# Patient Record
Sex: Female | Born: 1970 | Race: White | Hispanic: No | Marital: Married | Smoking: Never smoker
Health system: Southern US, Community
[De-identification: ages and names within clinical notes are randomized; demographics above are authoritative.]

## PROBLEM LIST (undated history)

## (undated) DIAGNOSIS — R112 Nausea with vomiting, unspecified: Secondary | ICD-10-CM

## (undated) DIAGNOSIS — N63 Unspecified lump in unspecified breast: Secondary | ICD-10-CM

## (undated) DIAGNOSIS — M722 Plantar fascial fibromatosis: Secondary | ICD-10-CM

## (undated) DIAGNOSIS — Z9889 Other specified postprocedural states: Secondary | ICD-10-CM

## (undated) DIAGNOSIS — D649 Anemia, unspecified: Secondary | ICD-10-CM

## (undated) DIAGNOSIS — Z87898 Personal history of other specified conditions: Secondary | ICD-10-CM

## (undated) DIAGNOSIS — I341 Nonrheumatic mitral (valve) prolapse: Secondary | ICD-10-CM

## (undated) HISTORY — DX: Personal history of other specified conditions: Z87.898

## (undated) HISTORY — PX: BREAST SURGERY: SHX581

## (undated) HISTORY — DX: Plantar fascial fibromatosis: M72.2

---

## 2010-03-21 ENCOUNTER — Ambulatory Visit: Payer: Self-pay | Admitting: Internal Medicine

## 2012-11-17 DIAGNOSIS — Z0000 Encounter for general adult medical examination without abnormal findings: Secondary | ICD-10-CM | POA: Insufficient documentation

## 2012-11-21 DIAGNOSIS — L299 Pruritus, unspecified: Secondary | ICD-10-CM | POA: Insufficient documentation

## 2013-05-10 DIAGNOSIS — R6882 Decreased libido: Secondary | ICD-10-CM | POA: Insufficient documentation

## 2015-11-07 ENCOUNTER — Other Ambulatory Visit: Payer: Self-pay | Admitting: Family Medicine

## 2015-11-07 DIAGNOSIS — Z1231 Encounter for screening mammogram for malignant neoplasm of breast: Secondary | ICD-10-CM

## 2015-11-27 ENCOUNTER — Other Ambulatory Visit: Payer: Self-pay | Admitting: Family Medicine

## 2015-11-27 ENCOUNTER — Ambulatory Visit
Admission: RE | Admit: 2015-11-27 | Discharge: 2015-11-27 | Disposition: A | Discharge status: Home / Self Care | Payer: BLUE CROSS/BLUE SHIELD | Source: Ambulatory Visit | Attending: Family Medicine | Admitting: Family Medicine

## 2015-11-27 DIAGNOSIS — Z1231 Encounter for screening mammogram for malignant neoplasm of breast: Secondary | ICD-10-CM | POA: Diagnosis present

## 2015-11-27 HISTORY — DX: Unspecified lump in unspecified breast: N63.0

## 2016-05-14 ENCOUNTER — Encounter: Payer: Self-pay | Admitting: Podiatry

## 2016-05-14 ENCOUNTER — Ambulatory Visit (INDEPENDENT_AMBULATORY_CARE_PROVIDER_SITE_OTHER): Payer: BLUE CROSS/BLUE SHIELD

## 2016-05-14 ENCOUNTER — Ambulatory Visit (INDEPENDENT_AMBULATORY_CARE_PROVIDER_SITE_OTHER): Payer: BLUE CROSS/BLUE SHIELD | Admitting: Podiatry

## 2016-05-14 ENCOUNTER — Encounter: Payer: Self-pay | Admitting: *Deleted

## 2016-05-14 DIAGNOSIS — M722 Plantar fascial fibromatosis: Secondary | ICD-10-CM | POA: Diagnosis not present

## 2016-05-14 DIAGNOSIS — M79672 Pain in left foot: Secondary | ICD-10-CM | POA: Diagnosis not present

## 2016-05-14 DIAGNOSIS — M79671 Pain in right foot: Secondary | ICD-10-CM

## 2016-05-14 MED ORDER — METHYLPREDNISOLONE 4 MG PO TBPK: ORAL_TABLET | ORAL | 0 refills | Status: DC

## 2016-05-14 MED ORDER — MELOXICAM 15 MG PO TABS: 15.0000 mg | ORAL_TABLET | Freq: Every day | ORAL | 3 refills | Status: DC

## 2016-05-14 NOTE — Progress Notes (Signed)
   Subjective:    Patient ID: Lisa Stevenson, female    DOB: [birth year], 45 y.o.   MRN: [id number]  HPI: She presents today with a chief complaint of bilateral heel pain and forefoot pain times the past 2 months. States that she has been working out with a high impact exercise regimen and has been wearing a lot of flats recently at work. 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    Review of Systems  All other systems reviewed and are negative.      Objective:   Physical Exam: Vital signs are stable alert and oriented 3. Pulses are palpable. Neurologic sensorium is intact per Semmes-Weinstein monofilament. Deep tendon reflexes are intact bilateral and muscle strength was 5 over 5 dorsiflexion plantar flexors and inverters everters on physical musculature is intact. Orthopedic evaluation demonstrates all joints distal to the ankle range of motion without crepitation. She is pain on palpation medial calcaneal tubercles bilateral some tenderness on palpation of the second metatarsophalangeal joints bilaterally. Radiographs 3 views bilateral taken today in the office demonstrates osseously mature individual with no signs of fractures. Small plantar distally oriented calcaneal heel spurs and soft tissue increase in density of the plantar fascia calcaneal insertion site is noted bilaterally and appears that the right appears to be worse than the left. Otherwise the radiographs are unremarkable of the rectus foot bilateral. Cutaneous evaluationof the well-hydrated cutis no erythema edema cellulitis drainage or odor.        Assessment & Plan:  Plantar fasciitis bilateral.  Plan: Discussed etiology and pathology conservative versus surgical therapies. Injected the bilateral heels today with Kenalog and local anesthetic. Started her on a Medrol Dosepak to be followed by meloxicam. We discussed appropriate shoe gear fishing exercises ice therapy shoe gear modifications. She was provided with both oral  and written home-going instructions for exercise therapy. I will follow up with her in 1 month.

## 2016-05-14 NOTE — Patient Instructions (Signed)

## 2016-06-18 ENCOUNTER — Ambulatory Visit: Payer: BLUE CROSS/BLUE SHIELD | Admitting: Podiatry

## 2016-09-23 ENCOUNTER — Other Ambulatory Visit: Payer: Self-pay | Admitting: Family Medicine

## 2016-09-23 DIAGNOSIS — R221 Localized swelling, mass and lump, neck: Secondary | ICD-10-CM

## 2016-09-30 ENCOUNTER — Ambulatory Visit
Admission: RE | Admit: 2016-09-30 | Discharge: 2016-09-30 | Disposition: A | Discharge status: Home / Self Care | Payer: BLUE CROSS/BLUE SHIELD | Source: Ambulatory Visit | Attending: Family Medicine | Admitting: Family Medicine

## 2016-09-30 DIAGNOSIS — R221 Localized swelling, mass and lump, neck: Secondary | ICD-10-CM | POA: Diagnosis present

## 2016-11-06 ENCOUNTER — Other Ambulatory Visit: Payer: Self-pay | Admitting: Unknown Physician Specialty

## 2016-11-06 DIAGNOSIS — R221 Localized swelling, mass and lump, neck: Secondary | ICD-10-CM

## 2016-11-13 ENCOUNTER — Ambulatory Visit
Admission: RE | Admit: 2016-11-13 | Discharge: 2016-11-13 | Disposition: A | Discharge status: Home / Self Care | Payer: BLUE CROSS/BLUE SHIELD | Source: Ambulatory Visit | Attending: Unknown Physician Specialty | Admitting: Unknown Physician Specialty

## 2016-11-13 ENCOUNTER — Encounter
Admission: RE | Admit: 2016-11-13 | Discharge: 2016-11-13 | Disposition: A | Discharge status: Home / Self Care | Payer: BLUE CROSS/BLUE SHIELD | Source: Ambulatory Visit

## 2016-11-13 DIAGNOSIS — R221 Localized swelling, mass and lump, neck: Secondary | ICD-10-CM | POA: Insufficient documentation

## 2016-11-13 DIAGNOSIS — E041 Nontoxic single thyroid nodule: Secondary | ICD-10-CM | POA: Diagnosis not present

## 2016-11-13 HISTORY — DX: Nonrheumatic mitral (valve) prolapse: I34.1

## 2016-11-13 HISTORY — DX: Anemia, unspecified: D64.9

## 2016-11-13 MED ORDER — IOPAMIDOL (ISOVUE-300) INJECTION 61%
75.0000 mL | Freq: Once | INTRAVENOUS | Status: AC | PRN
Start: 2016-11-13 — End: 2016-11-13
  Administered 2016-11-13: 75 mL via INTRAVENOUS

## 2016-11-13 NOTE — Patient Instructions (Signed)
  Your procedure is scheduled on: 11/18/16 Report to Same Day Surgery 2nd floor medical mall [facility] Entrance-take elevator on left to 2nd floor.  Check in with surgery information desk.) To find out your arrival time please call [phone] between 1PM - 3PM on 11/17/16  Remember: Instructions that are not followed completely may result in serious medical risk, up to and including death, or upon the discretion of your surgeon and anesthesiologist your surgery may need to be rescheduled.    _x___ 1. Do not eat food or drink liquids after midnight. No gum chewing or                              hard candies.     __x__ 2. No Alcohol for 24 hours before or after surgery.   __x__3. No Smoking for 24 prior to surgery.   ____  4. Bring all medications with you on the day of surgery if instructed.    __x__ 5. Notify your doctor if there is any change in your medical condition     (cold, fever, infections).     Do not wear jewelry, make-up, hairpins, clips or nail polish.  Do not wear lotions, powders, or perfumes. You may wear deodorant.  Do not shave 48 hours prior to surgery. Men may shave face and neck.  Do not bring valuables to the hospital.    [facility] is not responsible for any belongings or valuables.               Contacts, dentures or bridgework may not be worn into surgery.  Leave your suitcase in the car. After surgery it may be brought to your room.  For patients admitted to the hospital, discharge time is determined by your                       treatment team.   Patients discharged the day of surgery will not be allowed to drive home.  You will need someone to drive you home and stay with you the night of your procedure.     ____ Take anti-hypertensive (unless it includes a diuretic), cardiac, seizure, asthma,     anti-reflux and psychiatric medicines. These include:  1.   2.  3.  4.  5.  6.  ____Fleets enema or Magnesium Citrate as directed.   ____ Use CHG  Soap or sage wipes as directed on instruction sheet   ____ Use inhalers on the day of surgery and bring to hospital day of surgery  ____ Stop Metformin and Janumet 2 days prior to surgery.    ____ Take 1/2 of usual insulin dose the night before surgery and none on the morning     surgery.   ___ Follow recommendations from Cardiologist, Pulmonologist or PCP regarding  stopping Aspirin, Coumadin, Pllavix ,Eliquis, Effient, or Pradaxa, and Pletal.  ____Stop Anti-inflammatories such as Advil, Aleve, Ibuprofen, Motrin, Naproxen, Naprosyn, Goodies powders or aspirin products. OK to take Tylenol and Celebrex.   ___ Stop supplements until after surgery.  But may continue Vitamin D, Vitamin B,and multivitamin.   ____ Bring C-Pap to the hospital.

## 2016-11-18 ENCOUNTER — Encounter
Admission: RE | Disposition: A | Discharge status: Home / Self Care | Payer: Self-pay | Source: Ambulatory Visit | Attending: Unknown Physician Specialty

## 2016-11-18 ENCOUNTER — Ambulatory Visit
Admission: RE | Admit: 2016-11-18 | Discharge: 2016-11-18 | Disposition: A | Discharge status: Home / Self Care | Payer: BLUE CROSS/BLUE SHIELD | Source: Ambulatory Visit | Attending: Unknown Physician Specialty | Admitting: Unknown Physician Specialty

## 2016-11-18 ENCOUNTER — Ambulatory Visit: Payer: BLUE CROSS/BLUE SHIELD | Admitting: Anesthesiology

## 2016-11-18 ENCOUNTER — Encounter: Payer: Self-pay | Admitting: *Deleted

## 2016-11-18 DIAGNOSIS — R221 Localized swelling, mass and lump, neck: Secondary | ICD-10-CM | POA: Diagnosis present

## 2016-11-18 DIAGNOSIS — D170 Benign lipomatous neoplasm of skin and subcutaneous tissue of head, face and neck: Secondary | ICD-10-CM | POA: Insufficient documentation

## 2016-11-18 DIAGNOSIS — I341 Nonrheumatic mitral (valve) prolapse: Secondary | ICD-10-CM | POA: Insufficient documentation

## 2016-11-18 DIAGNOSIS — E039 Hypothyroidism, unspecified: Secondary | ICD-10-CM | POA: Insufficient documentation

## 2016-11-18 HISTORY — PX: EXCISION MASS NECK: SHX6703

## 2016-11-18 LAB — POCT PREGNANCY, URINE: PREG TEST UR: NEGATIVE

## 2016-11-18 SURGERY — EXCISION, MASS, NECK
Anesthesia: General

## 2016-11-18 MED ORDER — PROPOFOL 10 MG/ML IV BOLUS
INTRAVENOUS | Status: AC
  Filled 2016-11-18: qty 20

## 2016-11-18 MED ORDER — DEXAMETHASONE SODIUM PHOSPHATE 10 MG/ML IJ SOLN
INTRAMUSCULAR | Status: AC
  Filled 2016-11-18: qty 1

## 2016-11-18 MED ORDER — ONDANSETRON HCL 4 MG/2ML IJ SOLN
INTRAMUSCULAR | Status: DC | PRN
  Administered 2016-11-18: 4 mg via INTRAVENOUS

## 2016-11-18 MED ORDER — HYDROCODONE-ACETAMINOPHEN 5-325 MG PO TABS: 1.0000 | ORAL_TABLET | ORAL | 0 refills | Status: DC | PRN

## 2016-11-18 MED ORDER — LACTATED RINGERS IV SOLN
INTRAVENOUS | Status: DC
  Administered 2016-11-18 (×2): via INTRAVENOUS

## 2016-11-18 MED ORDER — FENTANYL CITRATE (PF) 100 MCG/2ML IJ SOLN
INTRAMUSCULAR | Status: AC
  Filled 2016-11-18: qty 2

## 2016-11-18 MED ORDER — FENTANYL CITRATE (PF) 100 MCG/2ML IJ SOLN: 25.0000 ug | INTRAMUSCULAR | Status: DC | PRN

## 2016-11-18 MED ORDER — ONDANSETRON HCL 4 MG/2ML IJ SOLN
INTRAMUSCULAR | Status: AC
Start: 2016-11-18 — End: 2016-11-18
  Administered 2016-11-18: 4 mg
  Filled 2016-11-18: qty 2

## 2016-11-18 MED ORDER — DEXAMETHASONE SODIUM PHOSPHATE 10 MG/ML IJ SOLN
INTRAMUSCULAR | Status: DC | PRN
  Administered 2016-11-18: 10 mg via INTRAVENOUS

## 2016-11-18 MED ORDER — FAMOTIDINE 20 MG PO TABS
20.0000 mg | ORAL_TABLET | Freq: Once | ORAL | Status: AC
  Administered 2016-11-18: 20 mg via ORAL

## 2016-11-18 MED ORDER — SUCCINYLCHOLINE CHLORIDE 20 MG/ML IJ SOLN
INTRAMUSCULAR | Status: AC
  Filled 2016-11-18: qty 1

## 2016-11-18 MED ORDER — PROMETHAZINE HCL 25 MG/ML IJ SOLN: 6.2500 mg | INTRAMUSCULAR | Status: DC | PRN

## 2016-11-18 MED ORDER — SUCCINYLCHOLINE CHLORIDE 20 MG/ML IJ SOLN
INTRAMUSCULAR | Status: DC | PRN
  Administered 2016-11-18: 100 mg via INTRAVENOUS

## 2016-11-18 MED ORDER — LIDOCAINE HCL (PF) 2 % IJ SOLN
INTRAMUSCULAR | Status: AC
  Filled 2016-11-18: qty 2

## 2016-11-18 MED ORDER — ONDANSETRON HCL 4 MG/2ML IJ SOLN
INTRAMUSCULAR | Status: AC
  Filled 2016-11-18: qty 2

## 2016-11-18 MED ORDER — FAMOTIDINE 20 MG PO TABS
ORAL_TABLET | ORAL | Status: AC
Start: 2016-11-18 — End: 2016-11-18
  Administered 2016-11-18: 20 mg via ORAL
  Filled 2016-11-18: qty 1

## 2016-11-18 MED ORDER — FENTANYL CITRATE (PF) 100 MCG/2ML IJ SOLN
INTRAMUSCULAR | Status: DC | PRN
  Administered 2016-11-18 (×2): 50 ug via INTRAVENOUS

## 2016-11-18 MED ORDER — LIDOCAINE HCL (CARDIAC) 20 MG/ML IV SOLN
INTRAVENOUS | Status: DC | PRN
Start: 2016-11-18 — End: 2016-11-18
  Administered 2016-11-18: 80 mg via INTRAVENOUS

## 2016-11-18 MED ORDER — MIDAZOLAM HCL 2 MG/2ML IJ SOLN
INTRAMUSCULAR | Status: DC | PRN
  Administered 2016-11-18: 2 mg via INTRAVENOUS

## 2016-11-18 MED ORDER — PROPOFOL 10 MG/ML IV BOLUS
INTRAVENOUS | Status: DC | PRN
  Administered 2016-11-18: 160 mg via INTRAVENOUS

## 2016-11-18 MED ORDER — BACITRACIN ZINC 500 UNIT/GM EX OINT
TOPICAL_OINTMENT | CUTANEOUS | Status: AC
  Filled 2016-11-18: qty 28.35

## 2016-11-18 MED ORDER — LIDOCAINE-EPINEPHRINE 1 %-1:100000 IJ SOLN
INTRAMUSCULAR | Status: DC | PRN
  Administered 2016-11-18: 2 mL

## 2016-11-18 MED ORDER — LIDOCAINE-EPINEPHRINE 1 %-1:100000 IJ SOLN
INTRAMUSCULAR | Status: AC
  Filled 2016-11-18: qty 1

## 2016-11-18 MED ORDER — MIDAZOLAM HCL 2 MG/2ML IJ SOLN
INTRAMUSCULAR | Status: AC
  Filled 2016-11-18: qty 2

## 2016-11-18 SURGICAL SUPPLY — 27 items
BLADE SURG 15 STRL LF DISP TIS (BLADE) ×1 IMPLANT
BLADE SURG 15 STRL SS (BLADE) ×1
CANISTER SUCT 1200ML W/VALVE (MISCELLANEOUS) ×2 IMPLANT
CORD BIP STRL DISP 12FT (MISCELLANEOUS) ×2 IMPLANT
DERMABOND ADVANCED (GAUZE/BANDAGES/DRESSINGS) ×1
DERMABOND ADVANCED .7 DNX12 (GAUZE/BANDAGES/DRESSINGS) ×1 IMPLANT
DRAPE MAG INST 16X20 L/F (DRAPES) ×2 IMPLANT
ELECT CAUTERY BLADE TIP 2.5 (TIP) ×2
ELECT REM PT RETURN 9FT ADLT (ELECTROSURGICAL) ×2
ELECTRODE CAUTERY BLDE TIP 2.5 (TIP) ×1 IMPLANT
ELECTRODE REM PT RTRN 9FT ADLT (ELECTROSURGICAL) ×1 IMPLANT
FORCEPS JEWEL BIP 4-3/4 STR (INSTRUMENTS) ×2 IMPLANT
GLOVE BIO SURGEON STRL SZ7.5 (GLOVE) ×2 IMPLANT
GOWN STRL REUS W/ TWL LRG LVL3 (GOWN DISPOSABLE) ×2 IMPLANT
GOWN STRL REUS W/TWL LRG LVL3 (GOWN DISPOSABLE) ×2
HOOK STAY BLUNT/RETRACTOR 5M (MISCELLANEOUS) IMPLANT
LABEL OR SOLS (LABEL) ×2 IMPLANT
NS IRRIG 500ML POUR BTL (IV SOLUTION) ×2 IMPLANT
PACK HEAD/NECK (MISCELLANEOUS) ×2 IMPLANT
PROBE MONO 100X0.75 ELECT 1.9M (MISCELLANEOUS) ×2 IMPLANT
SHEARS HARMONIC 9CM CVD (BLADE) IMPLANT
STAPLER SKIN PROX 35W (STAPLE) ×2 IMPLANT
SUCTION FRAZIER HANDLE 10FR (MISCELLANEOUS) ×1
SUCTION TUBE FRAZIER 10FR DISP (MISCELLANEOUS) ×1 IMPLANT
SUT SILK 2 0 (SUTURE) ×2
SUT SILK 2-0 18XBRD TIE 12 (SUTURE) ×2 IMPLANT
SUT VIC AB 4-0 RB1 18 (SUTURE) ×4 IMPLANT

## 2016-11-18 NOTE — Discharge Instructions (Signed)
AMBULATORY SURGERY  DISCHARGE INSTRUCTIONS   1) The drugs that you were given will stay in your system until tomorrow so for the next 24 hours you should not:  A) Drive an automobile B) Make any legal decisions C) Drink any alcoholic beverage   2) You may resume regular meals tomorrow.  Today it is better to start with liquids and gradually work up to solid foods.  You may eat anything you prefer, but it is better to start with liquids, then soup and crackers, and gradually work up to solid foods.   3) Please notify your doctor immediately if you have any unusual bleeding, trouble breathing, redness and pain at the surgery site, drainage, fever, or pain not relieved by medication.    4) Additional Instructions:  May shower  Please contact your physician with any problems or Same Day Surgery at [phone], Monday through Friday 6 am to 4 pm, or [place] at [facility] number at [phone].

## 2016-11-18 NOTE — Anesthesia Preprocedure Evaluation (Signed)
Anesthesia Evaluation  Patient identified by MRN, date of birth, ID band Patient awake    Reviewed: Allergy & Precautions, H&P , NPO status , Patient's Chart, lab work & pertinent test results, reviewed documented beta blocker date and time   History of Anesthesia Complications Negative for: history of anesthetic complications  Airway Mallampati: II  TM Distance: >3 FB Neck ROM: full    Dental  (+) Teeth Intact, Dental Advidsory Given   Pulmonary neg pulmonary ROS,           Cardiovascular Exercise Tolerance: Good (-) hypertension(-) angina(-) CAD, (-) Past MI, (-) Cardiac Stents and (-) CABG (-) dysrhythmias + Valvular Problems/Murmurs MVP      Neuro/Psych negative neurological ROS  negative psych ROS   GI/Hepatic negative GI ROS, Neg liver ROS,   Endo/Other  neg diabetesHypothyroidism   Renal/GU negative Renal ROS  negative genitourinary   Musculoskeletal   Abdominal   Peds  Hematology  (+) Blood dyscrasia, anemia ,   Anesthesia Other Findings Past Medical History: No date: Anemia 46 or 46 years old: Breast mass     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  No date: MVP (mitral valve prolapse)   Reproductive/Obstetrics negative OB ROS                             Anesthesia Physical Anesthesia Plan  ASA: II  Anesthesia Plan: General   Post-op Pain Management:    Induction: Intravenous  PONV Risk Score and Plan: 3 and Ondansetron, Dexamethasone and Midazolam  Airway Management Planned: Oral ETT  Additional Equipment:   Intra-op Plan:   Post-operative Plan: Extubation in OR  Informed Consent: I have reviewed the patients History and Physical, chart, labs and discussed the procedure including the risks, benefits and alternatives for the proposed anesthesia with the patient or authorized representative who has indicated his/her understanding and acceptance.   Dental  Advisory Given  Plan Discussed with: Anesthesiologist, CRNA and Surgeon  Anesthesia Plan Comments:         Anesthesia Quick Evaluation

## 2016-11-18 NOTE — Anesthesia Procedure Notes (Signed)
Procedure Name: Intubation Date/Time: 11/18/2016 8:26 AM Performed by: Silvana Newness Pre-anesthesia Checklist: Patient identified, Emergency Drugs available, Suction available, Patient being monitored and Timeout performed Patient Re-evaluated:Patient Re-evaluated prior to inductionOxygen Delivery Method: Circle system utilized Preoxygenation: Pre-oxygenation with 100% oxygen Intubation Type: IV induction Ventilation: Mask ventilation without difficulty Laryngoscope Size: Mac and 3 Grade View: Grade I Tube type: Oral (taped to left side of mouth to avoid surgical field on right) Tube size: 7.0 mm Number of attempts: 1 Airway Equipment and Method: Stylet Placement Confirmation: ETT inserted through vocal cords under direct vision,  positive ETCO2 and breath sounds checked- equal and bilateral Secured at: 18 cm Tube secured with: Tape Dental Injury: Teeth and Oropharynx as per pre-operative assessment

## 2016-11-18 NOTE — Anesthesia Post-op Follow-up Note (Cosign Needed)
Anesthesia QCDR form completed.        

## 2016-11-18 NOTE — Op Note (Signed)
11/18/2016  9:07 AM    Lisa Stevenson  [id number]   Pre-Op Dx: RIGHT NECK MASS  Post-op Dx: Right neck lipoma  Proc: Excision right anterior neck lipoma; facial nerve monitoring 30 minutes  Surg:  Beverly Gust T  Anes:  GOT  EBL:  Less than 5 cc  Comp:  None  Findings:  Lipomatous mass and superficial to the platysma muscle right anterior neck  Procedure: [name] was identified in the holding area taken the operating room placed in supine position. After general endotracheal anesthesia the table was turned 90. A facial nerve monitor was applied to the orbicularis oris muscle and remained intact and on throughout the case. Incision line was marked over the lipomatous mass. A local anesthetic of 1% lidocaine with 1 100,000 units of epinephrine was used to inject over the incision line a total of 2 cc was used. With the right neck and face prepped and draped sterilely a 15 blade was used to incise into the subcutaneous tissues. The lipomatous mass was identified using the short sharp scissors and the microbipolar the mass was dissected free. Any tissue which appeared to be nerve tissue was stimulated using the neurostimulator there was no stimulation of the orbicularis oris muscle. Care was taken to stay on the capsule of the lipomatous mass and was dissected free from the other subcutaneous tissues. Deep to the mass the platysma layer was identified and was not violated. With the mass removed in its entirety the wound was irrigated with saline with no active bleeding the subcutaneous taste tissues were closed using 4-0 Vicryl and the skin was closed using Dermabond. The patient was in return anesthesia where she was awakened in the operating room and taken recovery room stable condition.  Cultures: None  Specimens: Right neck lipoma   Dispo:   Good  Plan:  Discharged home follow-up 1 week  Gregrey Bloyd T  11/18/2016 9:07 AM

## 2016-11-18 NOTE — Anesthesia Postprocedure Evaluation (Signed)
Anesthesia Post Note  Patient: ARAIYAH CUMPTON  Procedure(s) Performed: Procedure(s) (LRB): EXCISION MASS NECK (N/A)  Patient location during evaluation: PACU Anesthesia Type: General Level of consciousness: awake and alert Pain management: pain level controlled Vital Signs Assessment: post-procedure vital signs reviewed and stable Respiratory status: spontaneous breathing, nonlabored ventilation, respiratory function stable and patient connected to nasal cannula oxygen Cardiovascular status: blood pressure returned to baseline and stable Postop Assessment: no signs of nausea or vomiting Anesthetic complications: no     Last Vitals:  Vitals:   11/18/16 1004 11/18/16 1114  BP: 121/68 111/66  Pulse: 78 69  Resp: 16 16  Temp:      Last Pain:  Vitals:   11/18/16 1004  TempSrc:   PainSc: 0-No pain                 Martha Clan

## 2016-11-18 NOTE — Transfer of Care (Signed)
Immediate Anesthesia Transfer of Care Note  Patient: Lisa Stevenson  Procedure(s) Performed: Procedure(s): EXCISION MASS NECK (N/A)  Patient Location: PACU  Anesthesia Type:General  Level of Consciousness: drowsy and patient cooperative  Airway & Oxygen Therapy: Patient Spontanous Breathing and Patient connected to face mask oxygen  Post-op Assessment: Report given to RN and Post -op Vital signs reviewed and stable  Post vital signs: Reviewed and stable  Last Vitals:  Vitals:   11/18/16 0746 11/18/16 0921  BP: 113/64   Pulse: 62   Resp: 20   Temp: 37.7 C (P) 36.5 C    Last Pain:  Vitals:   11/18/16 0746  TempSrc: Tympanic         Complications: No apparent anesthesia complications

## 2016-11-18 NOTE — H&P (Signed)
The patient's history has been reviewed, patient examined, no change in status, stable for surgery.  Questions were answered to the patients satisfaction.  

## 2016-11-19 LAB — SURGICAL PATHOLOGY

## 2016-11-20 ENCOUNTER — Encounter: Payer: Self-pay | Admitting: Unknown Physician Specialty

## 2018-03-08 NOTE — H&P (Signed)
  Pt will also undergo a minerva  Endometrial ablation at the time of her surgery

## 2018-03-08 NOTE — H&P (Signed)
[name] is a 47 y.o. female here for  Fractional d+c , H/S and myosure resection of endometrial polyp  .Pt with abnormal menstrual bleeding , occurring monthly and for 5 days . Passes clots for the first 3 days and she uses NSAIDs. . No dyspareunia . EMBX : wnl . Pap : neg      Past Medical History:  has no past medical history on file.  Past Surgical History:  has a past surgical history that includes lipoma removal and fibroadenoma removal of breast. Family History: family history includes Breast cancer in her maternal aunt; Uterine cancer in her maternal aunt. Social History:  reports that she has never smoked. She has never used smokeless tobacco. She reports that she drinks alcohol. OB/GYN History:          OB History    Gravida  4   Para  3   Term      Preterm      AB  1   Living  3     SAB      TAB  1   Ectopic      Molar      Multiple      Live Births  3          Allergies: has No Known Allergies. Medications:  Current Outpatient Medications:  .  Herbal Supplement, thyroid, Disp: , Rfl:   Review of Systems: General:                      No fatigue or weight loss Eyes:                           No vision changes Ears:                            No hearing difficulty Respiratory:                No cough or shortness of breath Pulmonary:                  No asthma or shortness of breath Cardiovascular:           No chest pain, palpitations, dyspnea on exertion Gastrointestinal:          No abdominal bloating, chronic diarrhea, constipations, masses, pain or hematochezia Genitourinary:             No hematuria, dysuria, abnormal vaginal discharge, pelvic pain,+menorrhagia Lymphatic:                   No swollen lymph nodes Musculoskeletal:         No muscle weakness Neurologic:                  No extremity weakness, syncope, seizure disorder Psychiatric:                  No history of depression, delusions or suicidal/homicidal  ideation    Exam:      Vitals:   02/23/18 1419  BP: 110/67  Pulse: 72    Body mass index is 26.46 kg/m.  WDWN white/  female in NAD   Lungs: CTA  CV : RRR without murmur   Neck:  no thyromegaly Abdomen: soft , no mass, normal active bowel sounds,  non-tender, no rebound tenderness Pelvic: tanner stage 5 ,  External genitalia: vulva /labia  no lesions Urethra: no prolapse Vagina: normal physiologic d/c Cervix: no lesions, no cervical motion tenderness   Uterus: 12 weeks .nrregular shape ormal size shape and contour, non-tender(previous sounded to 10 cm )  Adnexa: no mass,  non-tender   Rectovaginal:  Saline infusion sonohysterography: betadine prep to the cervix followed by placement of the HSG catheter into the endometrial canal . Sterile H2O is injected while performing a transvaginal u/s . Findings:endometrial polyp 1.4 x0.55x 0.84cm   The rest of u/s results : SALINE [place]   Ut wnl  Endometrium=15.86 mm Possible polyp seen=1.49 x 0.55 x 0.84 cm avg 0.96 cm   bil ovs wnl   Impression:   The primary encounter diagnosis was Menorrhagia with regular cycle. A diagnosis of Endometrial polyp was also pertinent to this visit.    Plan:  I have spoken with the patient regarding treatment options including expectant management, hormonal options, or surgical intervention. After a full discussion the pt elects to proceed with  Fractional dilation and curettage , myosure resection , Minerva endometrial ablation .   risks of the procedure have been explained including organ injury , blood transfusion  And infectious risks associated with blood transfusion , inability to complete the procedure secondary to inadvertent organ injury     All questions answered   Caroline Sauger, MD

## 2018-03-15 ENCOUNTER — Encounter
Admission: RE | Admit: 2018-03-15 | Discharge: 2018-03-15 | Disposition: A | Discharge status: Home / Self Care | Payer: BLUE CROSS/BLUE SHIELD | Source: Ambulatory Visit | Attending: Obstetrics and Gynecology | Admitting: Obstetrics and Gynecology

## 2018-03-15 ENCOUNTER — Other Ambulatory Visit: Payer: Self-pay

## 2018-03-15 DIAGNOSIS — Z01812 Encounter for preprocedural laboratory examination: Secondary | ICD-10-CM | POA: Diagnosis present

## 2018-03-15 HISTORY — DX: Other specified postprocedural states: R11.2

## 2018-03-15 HISTORY — DX: Other specified postprocedural states: Z98.890

## 2018-03-15 LAB — CBC
HEMATOCRIT: 37.9 % (ref 36.0–46.0)
Hemoglobin: 12 g/dL (ref 12.0–15.0)
MCH: 26 pg (ref 26.0–34.0)
MCHC: 31.7 g/dL (ref 30.0–36.0)
MCV: 82 fL (ref 80.0–100.0)
NRBC: 0 % (ref 0.0–0.2)
PLATELETS: 241 10*3/uL (ref 150–400)
RBC: 4.62 MIL/uL (ref 3.87–5.11)
RDW: 13.7 % (ref 11.5–15.5)
WBC: 7 10*3/uL (ref 4.0–10.5)

## 2018-03-15 LAB — BASIC METABOLIC PANEL
Anion gap: 6 (ref 5–15)
BUN: 13 mg/dL (ref 6–20)
CALCIUM: 9 mg/dL (ref 8.9–10.3)
CHLORIDE: 103 mmol/L (ref 98–111)
CO2: 27 mmol/L (ref 22–32)
Creatinine, Ser: 0.78 mg/dL (ref 0.44–1.00)
GFR calc non Af Amer: >60 mL/min (ref >60)
Glucose, Bld: 93 mg/dL (ref 70–99)
Potassium: 3.7 mmol/L (ref 3.5–5.1)
Sodium: 136 mmol/L (ref 135–145)

## 2018-03-15 LAB — TYPE AND SCREEN
ABO/RH(D): A POS
Antibody Screen: NEGATIVE

## 2018-03-15 NOTE — Patient Instructions (Signed)
Your procedure is scheduled on: Monday 03/22/18.  Report to DAY SURGERY DEPARTMENT LOCATED ON 2ND FLOOR MEDICAL MALL ENTRANCE. To find out your arrival time please call [phone] between 1PM - 3PM on Friday 03/19/18.  Remember: Instructions that are not followed completely may result in serious medical risk, up to and including death, or upon the discretion of your surgeon and anesthesiologist your surgery may need to be rescheduled.     _X__ 1. Do not eat food after midnight the night before your procedure.                 No gum chewing or hard candies. You may drink clear liquids up to 2 hours                 before you are scheduled to arrive for your surgery- DO NOT drink clear                 liquids within 2 hours of the start of your surgery.                 Clear Liquids include:  water, apple juice without pulp, clear carbohydrate                 drink such as Clearfast or Gatorade, Black Coffee or Tea (Do not add                 anything to coffee or tea).  __X__2.  On the morning of surgery brush your teeth with toothpaste and water, you may rinse your mouth with mouthwash if you wish.  Do not swallow any toothpaste of mouthwash.     _X__ 3.  No Alcohol for 24 hours before or after surgery.   _X__ 4.  Do Not Smoke or use e-cigarettes For 24 Hours Prior to Your Surgery.                 Do not use any chewable tobacco products for at least 6 hours prior to                 surgery.  ____  5.  Bring all medications with you on the day of surgery if instructed.   __X__  6.  Notify your doctor if there is any change in your medical condition      (cold, fever, infections).     Do not wear jewelry, make-up, hairpins, clips or nail polish. Do not wear lotions, powders, or perfumes.  Do not shave 48 hours prior to surgery.  Do not bring valuables to the hospital.    [facility] is not responsible for any belongings or valuables.  Contacts, dentures/partials or body  piercings may not be worn into surgery. Bring a case for your contacts, glasses or hearing aids, a denture cup will be supplied. Leave your suitcase in the car. After surgery it may be brought to your room. For patients admitted to the hospital, discharge time is determined by your treatment team.   Patients discharged the day of surgery will not be allowed to drive home.   Please read over the following fact sheets that you were given:   MRSA Information  __X__ Take these medicines the morning of surgery with A SIP OF WATER:     1. NONE  2.   3.   4.  5.  6.   __X__ Use CHG Soap as directed  __X__ Stop Anti-inflammatories 7 days before surgery such as  Advil, Ibuprofen, Motrin, BC or Goodies Powder, Naprosyn, Naproxen, Aleve, Aspirin, Meloxicam. May take Tylenol if needed for pain or discomfort.   __X__ Stop all herbal supplements today.

## 2018-03-22 ENCOUNTER — Encounter: Payer: Self-pay | Admitting: *Deleted

## 2018-03-22 ENCOUNTER — Encounter
Admission: RE | Disposition: A | Discharge status: Home / Self Care | Payer: Self-pay | Source: Ambulatory Visit | Attending: Obstetrics and Gynecology

## 2018-03-22 ENCOUNTER — Ambulatory Visit
Admission: RE | Admit: 2018-03-22 | Discharge: 2018-03-22 | Disposition: A | Discharge status: Home / Self Care | Payer: BLUE CROSS/BLUE SHIELD | Source: Ambulatory Visit | Attending: Obstetrics and Gynecology | Admitting: Obstetrics and Gynecology

## 2018-03-22 ENCOUNTER — Ambulatory Visit: Payer: BLUE CROSS/BLUE SHIELD | Admitting: Anesthesiology

## 2018-03-22 ENCOUNTER — Other Ambulatory Visit: Payer: Self-pay

## 2018-03-22 DIAGNOSIS — D250 Submucous leiomyoma of uterus: Secondary | ICD-10-CM | POA: Insufficient documentation

## 2018-03-22 DIAGNOSIS — E039 Hypothyroidism, unspecified: Secondary | ICD-10-CM | POA: Insufficient documentation

## 2018-03-22 DIAGNOSIS — N840 Polyp of corpus uteri: Secondary | ICD-10-CM | POA: Insufficient documentation

## 2018-03-22 DIAGNOSIS — I341 Nonrheumatic mitral (valve) prolapse: Secondary | ICD-10-CM | POA: Diagnosis not present

## 2018-03-22 DIAGNOSIS — N920 Excessive and frequent menstruation with regular cycle: Secondary | ICD-10-CM | POA: Insufficient documentation

## 2018-03-22 HISTORY — PX: DILITATION & CURRETTAGE/HYSTROSCOPY WITH NOVASURE ABLATION: SHX5568

## 2018-03-22 LAB — ABO/RH: ABO/RH(D): A POS

## 2018-03-22 LAB — POCT PREGNANCY, URINE: PREG TEST UR: NEGATIVE

## 2018-03-22 SURGERY — DILATATION & CURETTAGE/HYSTEROSCOPY WITH NOVASURE ABLATION
Anesthesia: General

## 2018-03-22 MED ORDER — DEXAMETHASONE SODIUM PHOSPHATE 10 MG/ML IJ SOLN
INTRAMUSCULAR | Status: DC | PRN
  Administered 2018-03-22: 10 mg via INTRAVENOUS

## 2018-03-22 MED ORDER — PROPOFOL 10 MG/ML IV BOLUS
INTRAVENOUS | Status: AC
  Filled 2018-03-22: qty 20

## 2018-03-22 MED ORDER — SUCCINYLCHOLINE CHLORIDE 20 MG/ML IJ SOLN
INTRAMUSCULAR | Status: AC
  Filled 2018-03-22: qty 1

## 2018-03-22 MED ORDER — ONDANSETRON HCL 4 MG/2ML IJ SOLN
INTRAMUSCULAR | Status: AC
  Filled 2018-03-22: qty 2

## 2018-03-22 MED ORDER — MIDAZOLAM HCL 2 MG/2ML IJ SOLN
INTRAMUSCULAR | Status: DC | PRN
  Administered 2018-03-22: 2 mg via INTRAVENOUS

## 2018-03-22 MED ORDER — SCOPOLAMINE 1 MG/3DAYS TD PT72
MEDICATED_PATCH | TRANSDERMAL | Status: AC
  Administered 2018-03-22: 1.5 mg via TRANSDERMAL
  Filled 2018-03-22: qty 1

## 2018-03-22 MED ORDER — FENTANYL CITRATE (PF) 100 MCG/2ML IJ SOLN
INTRAMUSCULAR | Status: AC
  Filled 2018-03-22: qty 2

## 2018-03-22 MED ORDER — PHENYLEPHRINE HCL 10 MG/ML IJ SOLN
INTRAMUSCULAR | Status: AC
  Filled 2018-03-22: qty 1

## 2018-03-22 MED ORDER — ACETAMINOPHEN 10 MG/ML IV SOLN
INTRAVENOUS | Status: AC
  Filled 2018-03-22: qty 100

## 2018-03-22 MED ORDER — PROMETHAZINE HCL 25 MG/ML IJ SOLN: 6.2500 mg | INTRAMUSCULAR | Status: DC | PRN

## 2018-03-22 MED ORDER — LACTATED RINGERS IV SOLN
INTRAVENOUS | Status: DC
  Administered 2018-03-22: 09:00:00 via INTRAVENOUS

## 2018-03-22 MED ORDER — SCOPOLAMINE 1 MG/3DAYS TD PT72
1.0000 | MEDICATED_PATCH | TRANSDERMAL | Status: DC
  Administered 2018-03-22: 1.5 mg via TRANSDERMAL

## 2018-03-22 MED ORDER — FAMOTIDINE 20 MG PO TABS
ORAL_TABLET | ORAL | Status: AC
  Administered 2018-03-22: 20 mg via ORAL
  Filled 2018-03-22: qty 1

## 2018-03-22 MED ORDER — FENTANYL CITRATE (PF) 100 MCG/2ML IJ SOLN
INTRAMUSCULAR | Status: DC | PRN
  Administered 2018-03-22 (×4): 25 ug via INTRAVENOUS

## 2018-03-22 MED ORDER — FAMOTIDINE 20 MG PO TABS
20.0000 mg | ORAL_TABLET | Freq: Once | ORAL | Status: AC
  Administered 2018-03-22: 20 mg via ORAL

## 2018-03-22 MED ORDER — PROPOFOL 500 MG/50ML IV EMUL
INTRAVENOUS | Status: AC
  Filled 2018-03-22: qty 50

## 2018-03-22 MED ORDER — FENTANYL CITRATE (PF) 100 MCG/2ML IJ SOLN
25.0000 ug | INTRAMUSCULAR | Status: DC | PRN
  Administered 2018-03-22 (×2): 25 ug via INTRAVENOUS

## 2018-03-22 MED ORDER — SILVER NITRATE-POT NITRATE 75-25 % EX MISC
CUTANEOUS | Status: AC
  Filled 2018-03-22: qty 1

## 2018-03-22 MED ORDER — CEFAZOLIN SODIUM-DEXTROSE 2-4 GM/100ML-% IV SOLN
INTRAVENOUS | Status: AC
  Filled 2018-03-22: qty 100

## 2018-03-22 MED ORDER — DEXAMETHASONE SODIUM PHOSPHATE 10 MG/ML IJ SOLN
INTRAMUSCULAR | Status: AC
  Filled 2018-03-22: qty 1

## 2018-03-22 MED ORDER — ACETAMINOPHEN 10 MG/ML IV SOLN
INTRAVENOUS | Status: DC | PRN
  Administered 2018-03-22: 1000 mg via INTRAVENOUS

## 2018-03-22 MED ORDER — MIDAZOLAM HCL 2 MG/2ML IJ SOLN
INTRAMUSCULAR | Status: AC
  Filled 2018-03-22: qty 2

## 2018-03-22 MED ORDER — ONDANSETRON HCL 4 MG/2ML IJ SOLN
INTRAMUSCULAR | Status: DC | PRN
  Administered 2018-03-22: 4 mg via INTRAVENOUS

## 2018-03-22 MED ORDER — KETOROLAC TROMETHAMINE 30 MG/ML IJ SOLN
INTRAMUSCULAR | Status: AC
  Filled 2018-03-22: qty 1

## 2018-03-22 MED ORDER — KETOROLAC TROMETHAMINE 30 MG/ML IJ SOLN
INTRAMUSCULAR | Status: DC | PRN
  Administered 2018-03-22: 30 mg via INTRAVENOUS

## 2018-03-22 MED ORDER — PROPOFOL 500 MG/50ML IV EMUL
INTRAVENOUS | Status: DC | PRN
  Administered 2018-03-22: 120 ug/kg/min via INTRAVENOUS

## 2018-03-22 MED ORDER — FENTANYL CITRATE (PF) 100 MCG/2ML IJ SOLN
INTRAMUSCULAR | Status: AC
  Administered 2018-03-22: 25 ug via INTRAVENOUS
  Filled 2018-03-22: qty 2

## 2018-03-22 MED ORDER — CEFAZOLIN SODIUM-DEXTROSE 2-4 GM/100ML-% IV SOLN
2.0000 g | Freq: Once | INTRAVENOUS | Status: AC
  Administered 2018-03-22: 2 g via INTRAVENOUS

## 2018-03-22 MED ORDER — PROPOFOL 10 MG/ML IV BOLUS
INTRAVENOUS | Status: DC | PRN
  Administered 2018-03-22: 50 mg via INTRAVENOUS

## 2018-03-22 SURGICAL SUPPLY — 19 items
ABLATOR SURESOUND NOVASURE (ABLATOR) ×1 IMPLANT
CANISTER SUCT 1200ML W/VALVE (MISCELLANEOUS) ×2 IMPLANT
CATH ROBINSON RED A/P 16FR (CATHETERS) ×3 IMPLANT
COVER WAND RF STERILE (DRAPES) ×2 IMPLANT
DEVICE MYOSURE LITE (MISCELLANEOUS) ×1 IMPLANT
GLOVE BIO SURGEON STRL SZ8 (GLOVE) ×2 IMPLANT
GOWN STRL REUS W/ TWL LRG LVL3 (GOWN DISPOSABLE) ×1 IMPLANT
GOWN STRL REUS W/ TWL XL LVL3 (GOWN DISPOSABLE) ×1 IMPLANT
GOWN STRL REUS W/TWL LRG LVL3 (GOWN DISPOSABLE) ×1
GOWN STRL REUS W/TWL XL LVL3 (GOWN DISPOSABLE) ×1
HANDPIECE ABLA MINERVA ENDO (MISCELLANEOUS) ×1 IMPLANT
IV LACTATED RINGERS 1000ML (IV SOLUTION) ×2 IMPLANT
KIT TURNOVER CYSTO (KITS) ×2 IMPLANT
NS IRRIG 500ML POUR BTL (IV SOLUTION) ×2 IMPLANT
PACK DNC HYST (MISCELLANEOUS) ×2 IMPLANT
PAD OB MATERNITY 4.3X12.25 (PERSONAL CARE ITEMS) ×2 IMPLANT
PAD PREP 24X41 OB/GYN DISP (PERSONAL CARE ITEMS) ×2 IMPLANT
TOWEL OR 17X26 4PK STRL BLUE (TOWEL DISPOSABLE) ×2 IMPLANT
TUBING CONNECTING 10 (TUBING) ×2 IMPLANT

## 2018-03-22 NOTE — Op Note (Signed)
NAME: Lisa Stevenson, Lisa Stevenson MEDICAL RECORD ID:[id number] ACCOUNT [id number] DATE OF BIRTH:[birth year] FACILITY: [facility] LOCATION: [facility] PHYSICIAN:Keshara Kiger Josefine Class, MD  OPERATIVE REPORT  DATE OF PROCEDURE:  03/22/2018  PREOPERATIVE DIAGNOSES:  1.  Menorrhagia. 2.  Endometrial polyp.  POSTOPERATIVE DIAGNOSES: 1.  Menorrhagia. 2.  A 1 cm submucosal fibroid.  SURGEON:  Boykin Nearing, MD   FIRST ASSISTANT:  Scrub tech.  ANESTHESIA:  IV sedation.  PROCEDURE: 1.  Fractional dilation and curettage. 2.  Hysteroscopy with MyoSure resection of a 1 cm submucosal fibroid. 3.  Minerva endometrial ablation.  FINDINGS:  A 1 cm submucosal fibroid at the 12 o'clock anterior close to the isthmic portion of the uterus.  DESCRIPTION OF PROCEDURE:  After adequate IV sedation with propofol.  The patient was positioned in the dorsal lithotomy position with legs in the candy cane stirrups.  Two grams of IV Ancef were administered.  Timeout was performed.  The patient's  bladder was catheterized yielding 100 mL of urine.  A weighted speculum was placed in the posterior vaginal vault and the anterior cervix was then grasped with a single tooth tenaculum.  An endocervical curettage was performed.  Uterus was then sounded  to  9 cm.  Cervix was then dilated to #15 Hanks dilator and the MyoSure hysteroscope was advanced into the endometrial cavity.  Fluffy endometrium and an approximately 1 cm anterior submucosal fibroid was noted.  The light MyoSure apparatus was brought up  and the submucosal fibroid was resected, paying attention not to resect into the myometrium.  Pictures were taken.  The hysteroscope was removed and an endometrial curettage was done lightly with adequate tissue.  The cervix was estimated to be 4 cm in  length.  The Minerva was then brought up to the operative field.  The Minerva rep was in the room for the procedure.  The Minerva was placed after dilating the cervix to 17  Hanks dilator.  Cavity assessment test was passed and the ablation took place for  120 seconds.  Hysteroscope was readvanced and showed normal charring effect.  Normal saline was used as a distending medium.  Pictures were again taken.  The procedure was terminated.  COMPLICATIONS:  There were no complications.  ESTIMATED BLOOD LOSS:  Minimal.  INTRAOPERATIVE FLUIDS:  300 mL.  URINE OUTPUT:  100 mL.  The patient tolerated the procedure well and was taken to recovery room in good condition.  She did receive 30 mg intravenous Toradol at the end of the case.  AN/NUANCE  D:03/22/2018 T:03/22/2018 JOB:[id number]

## 2018-03-22 NOTE — Anesthesia Preprocedure Evaluation (Signed)
Anesthesia Evaluation  Patient identified by MRN, date of birth, ID band Patient awake    Reviewed: Allergy & Precautions, H&P , NPO status , Patient's Chart, lab work & pertinent test results, reviewed documented beta blocker date and time   History of Anesthesia Complications (+) PONV and history of anesthetic complications  Airway Mallampati: II  TM Distance: >3 FB Neck ROM: full    Dental  (+) Teeth Intact, Dental Advidsory Given   Pulmonary neg pulmonary ROS,           Cardiovascular Exercise Tolerance: Good (-) hypertension(-) angina(-) CAD, (-) Past MI, (-) Cardiac Stents and (-) CABG (-) dysrhythmias + Valvular Problems/Murmurs MVP      Neuro/Psych negative neurological ROS  negative psych ROS   GI/Hepatic negative GI ROS, Neg liver ROS,   Endo/Other  neg diabetesHypothyroidism   Renal/GU negative Renal ROS  negative genitourinary   Musculoskeletal   Abdominal   Peds  Hematology  (+) Blood dyscrasia, anemia ,   Anesthesia Other Findings Past Medical History: No date: Anemia 19 or 47 years old: Breast mass     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  No date: MVP (mitral valve prolapse)   Reproductive/Obstetrics negative OB ROS                             Anesthesia Physical  Anesthesia Plan  ASA: II  Anesthesia Plan: General   Post-op Pain Management:    Induction: Intravenous  PONV Risk Score and Plan: 3 and Midazolam, Propofol infusion, TIVA, Ondansetron, Dexamethasone, Scopolamine patch - Pre-op and Treatment may vary due to age or medical condition  Airway Management Planned: Natural Airway and Simple Face Mask  Additional Equipment:   Intra-op Plan:   Post-operative Plan:   Informed Consent: I have reviewed the patients History and Physical, chart, labs and discussed the procedure including the risks, benefits and alternatives for the proposed  anesthesia with the patient or authorized representative who has indicated his/her understanding and acceptance.   Dental Advisory Given  Plan Discussed with: Anesthesiologist, CRNA and Surgeon  Anesthesia Plan Comments:         Anesthesia Quick Evaluation

## 2018-03-22 NOTE — Progress Notes (Signed)
Ready for Fx D+C and H/S and Minerva ablation  NPO , labs reviewed  All questions answered

## 2018-03-22 NOTE — Brief Op Note (Signed)
03/22/2018  10:24 AM  PATIENT:  Lisa Stevenson  47 y.o. female  PRE-OPERATIVE DIAGNOSIS:  menorrhagia, endometrial polyp  POST-OPERATIVE DIAGNOSIS:  menorrhagia, submucosal fibroid  PROCEDURE Fractional dilation curettage, Hysteroscopy with Myosure resection of 1 cm submucosal fibroid. Minerva ablation :     SURGEON:  Surgeon(s) and Role:    * Schermerhorn, Gwen Her, MD - Primary  PHYSICIAN ASSISTANT: scrub tech   ASSISTANTS: none   ANESTHESIA:   MAC  EBL:  Minimal    IOF 300cc , UO 100 cc  BLOOD ADMINISTERED:none  DRAINS: none   LOCAL MEDICATIONS USED:  NONE  SPECIMEN:  Source of Specimen:  ecc, endometrial curettings, submucosal fibroid  DISPOSITION OF SPECIMEN:  PATHOLOGY  COUNTS:  YES  TOURNIQUET:  * No tourniquets in log *  DICTATION: .Other Dictation: Dictation Number verbal  PLAN OF CARE: Discharge to home after PACU  PATIENT DISPOSITION:  PACU - hemodynamically stable.   Delay start of Pharmacological VTE agent (>24hrs) due to surgical blood loss or risk of bleeding: not applicable

## 2018-03-22 NOTE — Anesthesia Postprocedure Evaluation (Signed)
Anesthesia Post Note  Patient: AUGUSTA MIRKIN  Procedure(s) Performed: DILATATION & CURETTAGE/HYSTEROSCOPY WITH ABLATION- MINERVA (N/A )  Patient location during evaluation: PACU Anesthesia Type: General Level of consciousness: awake and alert Pain management: pain level controlled Vital Signs Assessment: post-procedure vital signs reviewed and stable Respiratory status: spontaneous breathing, nonlabored ventilation, respiratory function stable and patient connected to nasal cannula oxygen Cardiovascular status: blood pressure returned to baseline and stable Postop Assessment: no apparent nausea or vomiting Anesthetic complications: no     Last Vitals:  Vitals:   03/22/18 1109 03/22/18 1138  BP: 113/73 121/75  Pulse: 70 62  Resp: 16 16  Temp: (!) 36.3 C   SpO2: 97% 100%    Last Pain:  Vitals:   03/22/18 1109  TempSrc: Temporal  PainSc: 2                  Martha Clan

## 2018-03-22 NOTE — Transfer of Care (Signed)
Immediate Anesthesia Transfer of Care Note  Patient: Lisa Stevenson  Procedure(s) Performed: DILATATION & CURETTAGE/HYSTEROSCOPY WITH [place] (N/A )  Patient Location: PACU  Anesthesia Type:General  Level of Consciousness: awake, alert  and oriented  Airway & Oxygen Therapy: Patient Spontanous Breathing and Patient connected to nasal cannula oxygen  Post-op Assessment: Report given to RN and Post -op Vital signs reviewed and stable  Post vital signs: Reviewed and stable  Last Vitals:  Vitals Value Taken Time  BP 107/81 03/22/2018 10:31 AM  Temp 36.1 C 03/22/2018 10:31 AM  Pulse 77 03/22/2018 10:37 AM  Resp 18 03/22/2018 10:37 AM  SpO2 99 % 03/22/2018 10:37 AM  Vitals shown include unvalidated device data.  Last Pain:  Vitals:   03/22/18 1031  TempSrc:   PainSc: 0-No pain         Complications: No apparent anesthesia complications

## 2018-03-22 NOTE — Anesthesia Post-op Follow-up Note (Signed)
Anesthesia QCDR form completed.        

## 2018-03-22 NOTE — Discharge Instructions (Signed)
Dilation and Curettage or Vacuum Curettage, Care After °These instructions give you information about caring for yourself after your procedure. Your doctor may also give you more specific instructions. Call your doctor if you have any problems or questions after your procedure. °Follow these instructions at home: °Activity °· Do not drive or use heavy machinery while taking prescription pain medicine. °· For 24 hours after your procedure, avoid driving. °· Take short walks often, followed by rest periods. Ask your doctor what activities are safe for you. After one or two days, you may be able to return to your normal activities. °· Do not lift anything that is heavier than 10 lb (4.5 kg) until your doctor approves. °· For at least 2 weeks, or as long as told by your doctor: °? Do not douche. °? Do not use tampons. °? Do not have sex. °General instructions °· Take over-the-counter and prescription medicines only as told by your doctor. This is very important if you take blood thinning medicine. °· Do not take baths, swim, or use a hot tub until your doctor approves. Take showers instead of baths. °· Wear compression stockings as told by your doctor. °· It is up to you to get the results of your procedure. Ask your doctor when your results will be ready. °· Keep all follow-up visits as told by your doctor. This is important. °Contact a doctor if: °· You have very bad cramps that get worse or do not get better with medicine. °· You have very bad pain in your belly (abdomen). °· You cannot drink fluids without throwing up (vomiting). °· You get pain in a different part of the area between your belly and thighs (pelvis). °· You have bad-smelling discharge from your vagina. °· You have a rash. °Get help right away if: °· You are bleeding a lot from your vagina. A lot of bleeding means soaking more than one sanitary pad in an hour, for 2 hours in a row. °· You have clumps of blood (blood clots) coming from your  vagina. °· You have a fever or chills. °· Your belly feels very tender or hard. °· You have chest pain. °· You have trouble breathing. °· You cough up blood. °· You feel dizzy. °· You feel light-headed. °· You pass out (faint). °· You have pain in your neck or shoulder area. °Summary °· Take short walks often, followed by rest periods. Ask your doctor what activities are safe for you. After one or two days, you may be able to return to your normal activities. °· Do not lift anything that is heavier than 10 lb (4.5 kg) until your doctor approves. °· Do not take baths, swim, or use a hot tub until your doctor approves. Take showers instead of baths. °· Contact your doctor if you have any symptoms of infection, like bad-smelling discharge from your vagina. °This information is not intended to replace advice given to you by your health care provider. Make sure you discuss any questions you have with your health care provider. °Document Released: 02/26/2008 Document Revised: 02/04/2016 Document Reviewed: 02/04/2016 °Elsevier Interactive Patient Education © 2017 [employer] °AMBULATORY SURGERY  °DISCHARGE INSTRUCTIONS ° ° °1) The drugs that you were given will stay in your system until tomorrow so for the next 24 hours you should not: ° °A) Drive an automobile °B) Make any legal decisions °C) Drink any alcoholic beverage ° ° °2) You may resume regular meals tomorrow.  Today it is better to start   with liquids and gradually work up to solid foods. ° °You may eat anything you prefer, but it is better to start with liquids, then soup and crackers, and gradually work up to solid foods. ° ° °3) Please notify your doctor immediately if you have any unusual bleeding, trouble breathing, redness and pain at the surgery site, drainage, fever, or pain not relieved by medication. ° ° ° °4) Additional Instructions: ° ° ° ° ° ° ° °Please contact your physician with any problems or Same Day Surgery at [phone], Monday through  Friday 6 am to 4 pm, or [place] [facility] [place] Main number at [phone]. °

## 2018-03-23 ENCOUNTER — Encounter: Payer: Self-pay | Admitting: Obstetrics and Gynecology

## 2018-03-23 LAB — SURGICAL PATHOLOGY

## 2018-07-21 IMAGING — CT CT NECK W/ CM
2 of 3 series · 8 of 14 positions shown, 9 images · IV contrast (iopamidol)
Comparison: None.

CLINICAL DATA: Right neck mass.  Right submandibular swelling

EXAM:
CT NECK WITH CONTRAST
TECHNIQUE: Multidetector CT imaging of the neck was performed using the
standard protocol following the bolus administration of intravenous
contrast.
CONTRAST:  75mL LD357L-EHH IOPAMIDOL (LD357L-EHH) INJECTION 61%

[Series 2: axial neck · axial · 0.54mm/px · z∈[-317,-161]mm · 4 of 131 slices shown]
[im 27/131  bone]
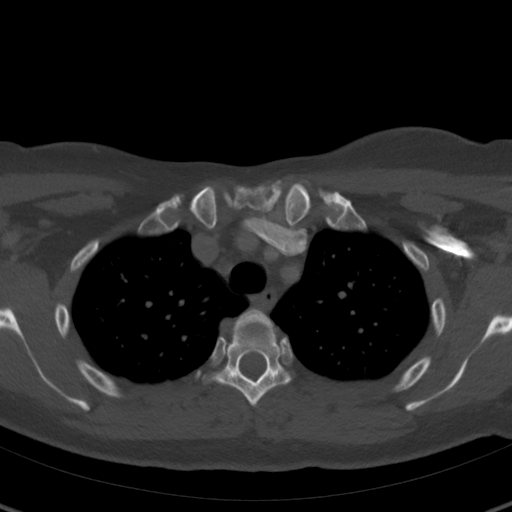
[im 53/131  bone]
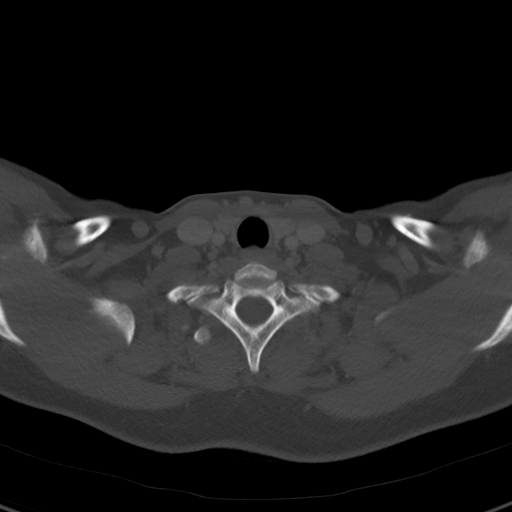
[im 79/131  bone]
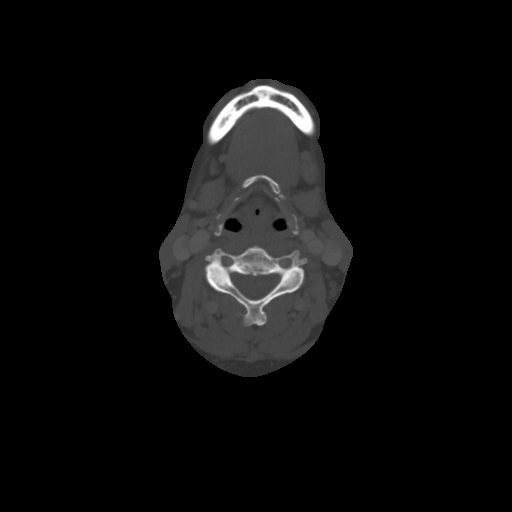
[im 105/131  bone]
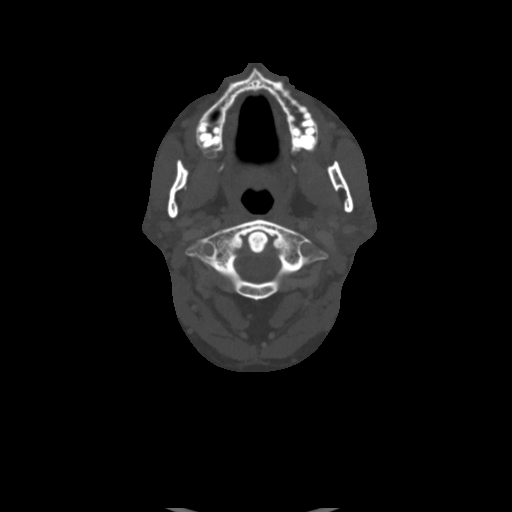

[Series 8: orthogonal ax · axial · 0.46mm/px · z∈[-348,-167]mm · 4 of 154 slices shown, 5 images]
[im 31/154  soft-tissue]
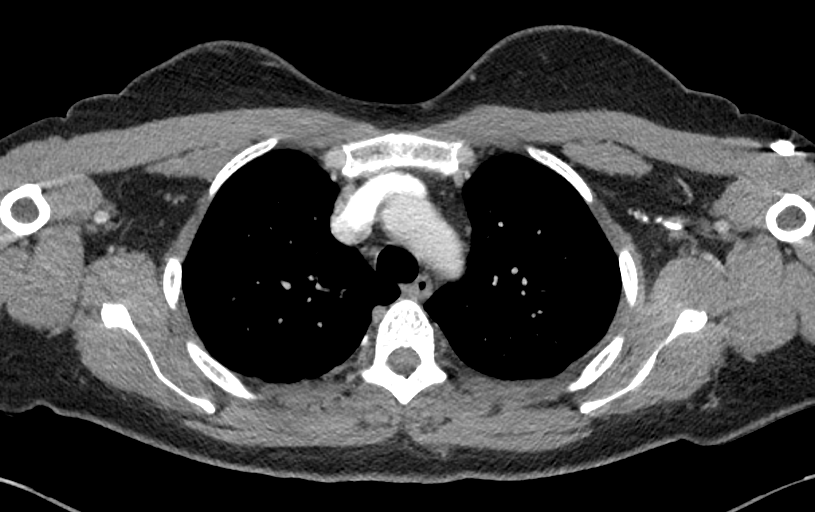
[im 31/154  bone]
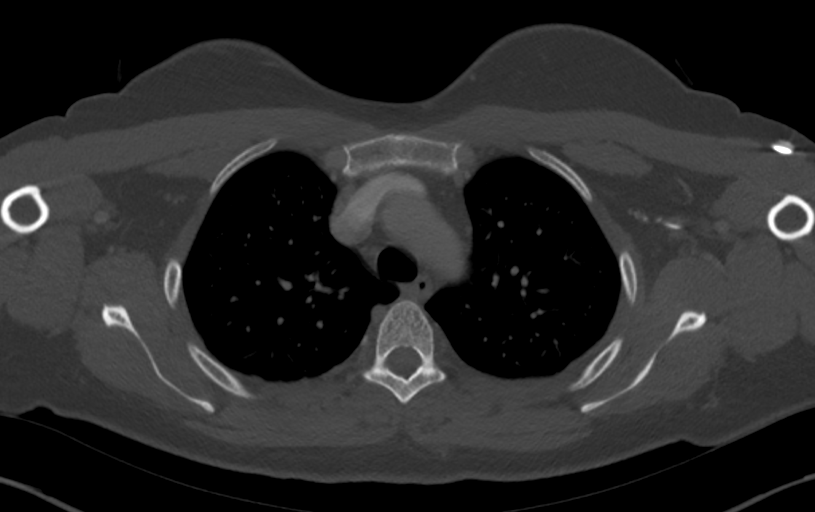
[im 62/154  bone]
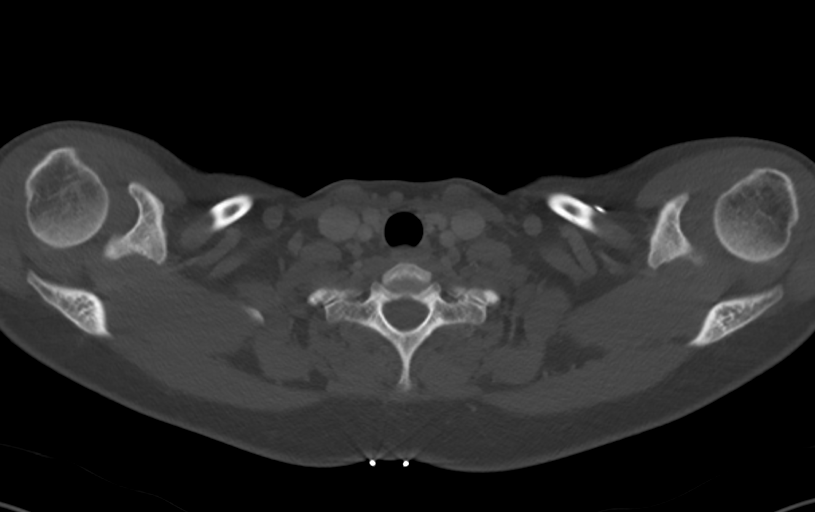
[im 92/154  bone]
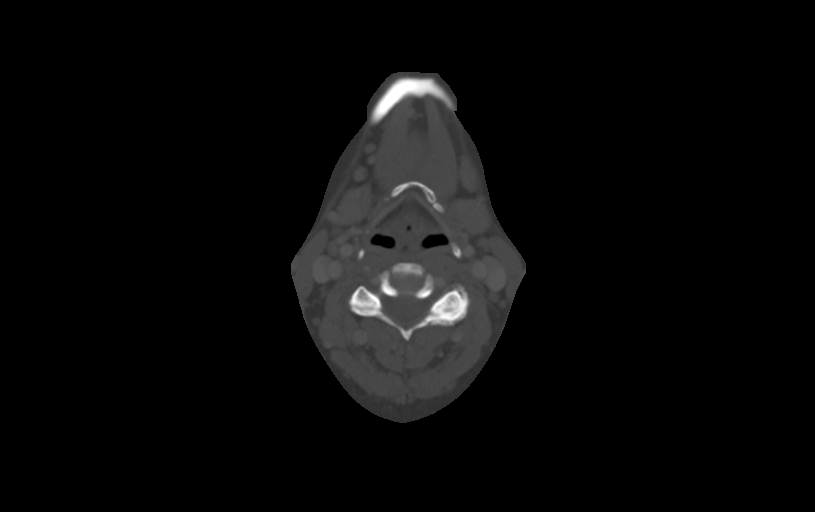
[im 123/154  bone]
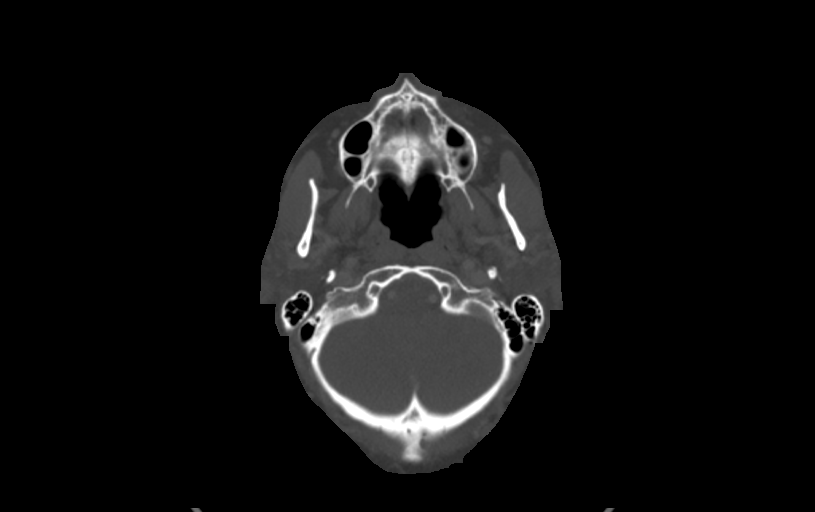

[8 of 14 positions shown; findings below may reference images not displayed]

FINDINGS: Pharynx and larynx: Normal. No mass or swelling.

Salivary glands: No inflammation, mass, or stone.

Thyroid: Thyroid normal in size. Ill-defined 9 mm nodule right
thyroid.

Lymph nodes: No pathologic adenopathy. Scattered nodes in the neck
bilaterally. 6 mm submental node in the midline. Right submandibular
node 5.4 mm. Right level 2 node 5.8 mm. Left submandibular node
mm. Left level 2 lymph node 5 mm. 5 mm left lower level 2 lymph
node.

Vascular: Negative

Limited intracranial: Negative

Visualized orbits: Negative

Mastoids and visualized paranasal sinuses: Negative

Skeleton: Mild cervical spine degenerative change. No acute skeletal
abnormality.

Upper chest: Negative

Other: None
IMPRESSION: Scattered small benign-appearing lymph nodes in the neck
bilaterally.

9 mm right thyroid nodule.  Recommend thyroid ultrasound

## 2019-03-03 ENCOUNTER — Ambulatory Visit: Payer: Self-pay

## 2019-03-03 DIAGNOSIS — Z23 Encounter for immunization: Secondary | ICD-10-CM

## 2019-08-17 DIAGNOSIS — H5213 Myopia, bilateral: Secondary | ICD-10-CM | POA: Diagnosis not present

## 2019-09-29 DIAGNOSIS — G8929 Other chronic pain: Secondary | ICD-10-CM | POA: Diagnosis not present

## 2019-09-29 DIAGNOSIS — M545 Low back pain, unspecified: Secondary | ICD-10-CM | POA: Insufficient documentation

## 2019-09-29 DIAGNOSIS — M25551 Pain in right hip: Secondary | ICD-10-CM | POA: Insufficient documentation

## 2019-09-29 DIAGNOSIS — M722 Plantar fascial fibromatosis: Secondary | ICD-10-CM | POA: Diagnosis not present

## 2019-10-19 DIAGNOSIS — N920 Excessive and frequent menstruation with regular cycle: Secondary | ICD-10-CM | POA: Insufficient documentation

## 2019-10-19 NOTE — Progress Notes (Signed)
Scheduled to complete physical 10/27/19 - no provider. Physical will have to reschedule.  AMD

## 2019-10-20 ENCOUNTER — Ambulatory Visit: Payer: Self-pay

## 2019-10-20 ENCOUNTER — Other Ambulatory Visit: Payer: Self-pay

## 2019-10-20 DIAGNOSIS — Z0000 Encounter for general adult medical examination without abnormal findings: Secondary | ICD-10-CM

## 2019-10-20 LAB — POCT URINALYSIS DIPSTICK
Bilirubin, UA: NEGATIVE
Blood, UA: NEGATIVE
Glucose, UA: NEGATIVE
Ketones, UA: NEGATIVE
Leukocytes, UA: NEGATIVE
Nitrite, UA: NEGATIVE
Protein, UA: NEGATIVE
Spec Grav, UA: 1.025 (ref 1.010–1.025)
Urobilinogen, UA: 0.2 E.U./dL
pH, UA: 6 (ref 5.0–8.0)

## 2019-10-21 LAB — CMP12+LP+TP+TSH+6AC+CBC/D/PLT
ALT: 9 IU/L (ref 0–32)
AST: 13 IU/L (ref 0–40)
Albumin/Globulin Ratio: 1.4 (ref 1.2–2.2)
Albumin: 4.4 g/dL (ref 3.8–4.8)
Alkaline Phosphatase: 60 IU/L (ref 48–121)
BUN/Creatinine Ratio: 16 (ref 9–23)
BUN: 13 mg/dL (ref 6–24)
Basophils Absolute: 0 10*3/uL (ref 0.0–0.2)
Basos: 0 %
Bilirubin Total: 0.7 mg/dL (ref 0.0–1.2)
Calcium: 9.5 mg/dL (ref 8.7–10.2)
Chloride: 104 mmol/L (ref 96–106)
Chol/HDL Ratio: 3.6 ratio (ref 0.0–4.4)
Cholesterol, Total: 171 mg/dL (ref 100–199)
Creatinine, Ser: 0.79 mg/dL (ref 0.57–1.00)
EOS (ABSOLUTE): 0.1 10*3/uL (ref 0.0–0.4)
Eos: 1 %
Estimated CHD Risk: 0.7 times avg. (ref 0.0–1.0)
Free Thyroxine Index: 1.9 (ref 1.2–4.9)
GFR calc Af Amer: 101 mL/min/{1.73_m2} (ref >59)
GFR calc non Af Amer: 88 mL/min/{1.73_m2} (ref >59)
GGT: 11 IU/L (ref 0–60)
Globulin, Total: 3.1 g/dL (ref 1.5–4.5)
Glucose: 106 mg/dL — ABNORMAL HIGH (ref 65–99)
HDL: 47 mg/dL (ref >39)
Hematocrit: 45.5 % (ref 34.0–46.6)
Hemoglobin: 15.3 g/dL (ref 11.1–15.9)
Immature Grans (Abs): 0 10*3/uL (ref 0.0–0.1)
Immature Granulocytes: 0 %
Iron: 86 ug/dL (ref 27–159)
LDH: 123 IU/L (ref 119–226)
LDL Chol Calc (NIH): 108 mg/dL — ABNORMAL HIGH (ref 0–99)
Lymphocytes Absolute: 1.4 10*3/uL (ref 0.7–3.1)
Lymphs: 18 %
MCH: 30.6 pg (ref 26.6–33.0)
MCHC: 33.6 g/dL (ref 31.5–35.7)
MCV: 91 fL (ref 79–97)
Monocytes Absolute: 0.6 10*3/uL (ref 0.1–0.9)
Monocytes: 8 %
Neutrophils Absolute: 5.5 10*3/uL (ref 1.4–7.0)
Neutrophils: 73 %
Phosphorus: 3.5 mg/dL (ref 3.0–4.3)
Platelets: 186 10*3/uL (ref 150–450)
Potassium: 4.1 mmol/L (ref 3.5–5.2)
RBC: 5 x10E6/uL (ref 3.77–5.28)
RDW: 12.7 % (ref 11.7–15.4)
Sodium: 141 mmol/L (ref 134–144)
T3 Uptake Ratio: 29 % (ref 24–39)
T4, Total: 6.6 ug/dL (ref 4.5–12.0)
TSH: 2.49 u[IU]/mL (ref 0.450–4.500)
Total Protein: 7.5 g/dL (ref 6.0–8.5)
Triglycerides: 88 mg/dL (ref 0–149)
Uric Acid: 3.9 mg/dL (ref 2.6–6.2)
VLDL Cholesterol Cal: 16 mg/dL (ref 5–40)
WBC: 7.6 10*3/uL (ref 3.4–10.8)

## 2019-11-07 ENCOUNTER — Ambulatory Visit: Payer: Self-pay | Admitting: Physician Assistant

## 2019-11-07 ENCOUNTER — Other Ambulatory Visit: Payer: Self-pay

## 2019-11-07 VITALS — BP 129/74 | HR 77 | Temp 97.3°F | Resp 14 | Ht 63.0 in | Wt 172.0 lb

## 2019-11-07 DIAGNOSIS — Z0000 Encounter for general adult medical examination without abnormal findings: Secondary | ICD-10-CM

## 2019-11-07 NOTE — Progress Notes (Signed)
   Subjective: Physical exam    Patient ID: Lisa Stevenson, female    DOB: March 17, 1971, 49 y.o.   MRN: [id number]  HPI Patient presents for annual exam voices no concerns or complaints.   Review of Systems    Negative Objective:   Physical Exam No acute distress.  HEENT is unremarkable.  Neck is supple for appendectomy or bruits.  Lungs clear to auscultation.  Heart regular rate and rhythm.  EKG will be read by supervising physician.  No obvious deformity to the upper or lower extremities.  Patient has full and equal range of motion of the upper and lower extremities.  No obvious deformity to the cervical lumbar spine.  Patient has full neck range of motion cervical lumbar spine.  Cranial nerves II through XII grossly intact.       Assessment & Plan: Well exam.  Discussed patient labs and advised follow-up as needed.

## 2019-12-19 DIAGNOSIS — R69 Illness, unspecified: Secondary | ICD-10-CM | POA: Diagnosis not present

## 2020-01-25 ENCOUNTER — Ambulatory Visit: Payer: Self-pay | Admitting: Emergency Medicine

## 2020-01-25 ENCOUNTER — Encounter: Payer: Self-pay | Admitting: Emergency Medicine

## 2020-01-25 ENCOUNTER — Other Ambulatory Visit: Payer: Self-pay

## 2020-01-25 VITALS — BP 120/70 | HR 70 | Resp 14 | Ht 67.5 in | Wt 172.0 lb

## 2020-01-25 DIAGNOSIS — H8110 Benign paroxysmal vertigo, unspecified ear: Secondary | ICD-10-CM

## 2020-01-25 DIAGNOSIS — H9201 Otalgia, right ear: Secondary | ICD-10-CM

## 2020-01-25 MED ORDER — METHYLPREDNISOLONE 4 MG PO TBPK: ORAL_TABLET | ORAL | 0 refills | Status: DC

## 2020-01-25 MED ORDER — MECLIZINE HCL 25 MG PO TABS: 25.0000 mg | ORAL_TABLET | Freq: Three times a day (TID) | ORAL | 1 refills | Status: DC | PRN

## 2020-01-25 NOTE — Progress Notes (Signed)
  Occupational Health Provider Note       Time seen: 11:00 AM    I have reviewed the vital signs and the nursing notes.  HISTORY   Chief Complaint Ear Pain    HPI Lisa Stevenson is a 49 y.o. female with a history of anemia, mitral valve prolapse who presents today for pain behind her right ear.  She states this started yesterday on Monday morning, she has been having some intermittent bouts of vertigo and last had it 7 to 8 years ago.  Has not had any ear drainage, fevers or other complaints.  Past Medical History:  Diagnosis Date  . Anemia   . 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 MVP (mitral valve prolapse)   . PONV (postoperative nausea and vomiting)     Past Surgical History:  Procedure Laterality Date  . BREAST SURGERY    . DILITATION & CURRETTAGE/HYSTROSCOPY WITH NOVASURE ABLATION N/A 03/22/2018   Procedure: DILATATION & CURETTAGE/HYSTEROSCOPY WITH ABLATION- MINERVA;  Surgeon: Schermerhorn, Gwen Her, MD;  Location: [facility] ORS;  Service: Gynecology;  Laterality: N/A;  . EXCISION MASS NECK N/A 11/18/2016   Procedure: EXCISION MASS NECK;  Surgeon: Beverly Gust, MD;  Location: [facility] ORS;  Service: ENT;  Laterality: N/A;    Allergies Patient has no known allergies.   Review of Systems Constitutional: Negative for fever. HEENT: Positive for pain behind the right ear, vertigo Cardiovascular: Negative for chest pain. Respiratory: Negative for shortness of breath. Gastrointestinal: Negative for abdominal pain, vomiting and diarrhea. Musculoskeletal: Negative for back pain. Skin: Negative for rash. Neurological: Negative for headaches, focal weakness or numbness.  All systems negative/normal/unremarkable except as stated in the HPI  ____________________________________________   PHYSICAL EXAM:  VITAL SIGNS: Vitals:   01/25/20 1021  BP: 120/70  Pulse: 70  Resp: 14  SpO2: 99%    Constitutional: Alert and oriented. Well  appearing and in no distress. Eyes: Conjunctivae are normal. Normal extraocular movements. ENT      Head: Normocephalic and atraumatic.      Ears: TMs are unremarkable, there is some tenderness inferior to the right ear, no obvious adenopathy.  Ear canal is normal.      Nose: No congestion/rhinnorhea.      Mouth/Throat: Mucous membranes are moist.      Neck: No stridor. Cardiovascular: Normal rate, regular rhythm. No murmurs, rubs, or gallops. Respiratory: Normal respiratory effort without tachypnea nor retractions. Breath sounds are clear and equal bilaterally. No wheezes/rales/rhonchi. Musculoskeletal: Nontender with normal range of motion in extremities. No lower extremity tenderness nor edema. Neurologic:  Normal speech and language. No gross focal neurologic deficits are appreciated.  Residual Bell's palsy is noted ___________________________________________   LABS (pertinent positives/negatives)  No results found for this or any previous visit (from the past 2160 hour(s)).   ASSESSMENT AND PLAN  Otalgia, vertigo   Plan: The patient had presented for pain inferior to the right ear and some intermittent vertigo.  She is in no acute distress, will treat with steroids and as needed meclizine.  Unclear etiology for the pain.  Advised to return for worsening or worrisome symptoms.  Lenise Arena MD    Note: This note was generated in part or whole with voice recognition software. Voice recognition is usually quite accurate but there are transcription errors that can and very often do occur. I apologize for any typographical errors that were not detected and corrected.

## 2020-01-25 NOTE — Progress Notes (Signed)
Pt states that behind her right ear is painful, and that started yesterday but Monday morning she started having vertigo and she hasnt had it about 7 or 8 years ago. Pt also has hx of ear problems CL,RMA

## 2020-10-16 NOTE — Progress Notes (Deleted)
Pt scheduled to complete physical with Hope Neese,FNP 10/23/20. Burna Sis

## 2020-10-17 DIAGNOSIS — Z0000 Encounter for general adult medical examination without abnormal findings: Secondary | ICD-10-CM

## 2020-10-17 NOTE — Progress Notes (Signed)
Scheduled to complete physical 10/23/20 with Debroah Baller, FNP.  AMD  EKG reviewed by Hyman Bible, PA-C Patient presents with no C/O's or distress. Here for labs pre-procedures for annual physical.  AMD

## 2020-10-18 ENCOUNTER — Other Ambulatory Visit: Payer: Self-pay

## 2020-10-18 ENCOUNTER — Ambulatory Visit: Payer: Self-pay

## 2020-10-18 DIAGNOSIS — Z0000 Encounter for general adult medical examination without abnormal findings: Secondary | ICD-10-CM

## 2020-10-18 LAB — POCT URINALYSIS DIPSTICK
Bilirubin, UA: NEGATIVE
Blood, UA: NEGATIVE
Glucose, UA: NEGATIVE
Ketones, UA: POSITIVE
Leukocytes, UA: NEGATIVE
Nitrite, UA: NEGATIVE
Protein, UA: NEGATIVE
Spec Grav, UA: 1.015 (ref 1.010–1.025)
Urobilinogen, UA: 0.2 U/dL
pH, UA: 6 (ref 5.0–8.0)

## 2020-10-19 LAB — CMP12+LP+TP+TSH+6AC+CBC/D/PLT
ALT: 17 IU/L (ref 0–32)
AST: 19 IU/L (ref 0–40)
Albumin/Globulin Ratio: 1.6 (ref 1.2–2.2)
Albumin: 4.6 g/dL (ref 3.8–4.9)
Alkaline Phosphatase: 61 IU/L (ref 44–121)
BUN/Creatinine Ratio: 15 (ref 9–23)
BUN: 12 mg/dL (ref 6–24)
Basophils Absolute: 0 10*3/uL (ref 0.0–0.2)
Basos: 1 %
Bilirubin Total: 0.4 mg/dL (ref 0.0–1.2)
Calcium: 9 mg/dL (ref 8.7–10.2)
Chloride: 99 mmol/L (ref 96–106)
Chol/HDL Ratio: 3.4 ratio (ref 0.0–4.4)
Cholesterol, Total: 142 mg/dL (ref 100–199)
Creatinine, Ser: 0.79 mg/dL (ref 0.57–1.00)
EOS (ABSOLUTE): 0.1 10*3/uL (ref 0.0–0.4)
Eos: 1 %
Estimated CHD Risk: 0.5 times avg. (ref 0.0–1.0)
Free Thyroxine Index: 2.1 (ref 1.2–4.9)
GGT: 12 IU/L (ref 0–60)
Globulin, Total: 2.8 g/dL (ref 1.5–4.5)
Glucose: 96 mg/dL (ref 65–99)
HDL: 42 mg/dL (ref >39)
Hematocrit: 44.4 % (ref 34.0–46.6)
Hemoglobin: 15.2 g/dL (ref 11.1–15.9)
Immature Grans (Abs): 0 10*3/uL (ref 0.0–0.1)
Immature Granulocytes: 0 %
Iron: 29 ug/dL (ref 27–159)
LDH: 131 IU/L (ref 119–226)
LDL Chol Calc (NIH): 82 mg/dL (ref 0–99)
Lymphocytes Absolute: 1.1 10*3/uL (ref 0.7–3.1)
Lymphs: 20 %
MCH: 30.2 pg (ref 26.6–33.0)
MCHC: 34.2 g/dL (ref 31.5–35.7)
MCV: 88 fL (ref 79–97)
Monocytes Absolute: 0.8 10*3/uL (ref 0.1–0.9)
Monocytes: 14 %
Neutrophils Absolute: 3.5 10*3/uL (ref 1.4–7.0)
Neutrophils: 64 %
Phosphorus: 2.6 mg/dL — ABNORMAL LOW (ref 3.0–4.3)
Platelets: 181 10*3/uL (ref 150–450)
Potassium: 3.5 mmol/L (ref 3.5–5.2)
RBC: 5.03 x10E6/uL (ref 3.77–5.28)
RDW: 12.7 % (ref 11.7–15.4)
Sodium: 138 mmol/L (ref 134–144)
T3 Uptake Ratio: 29 % (ref 24–39)
T4, Total: 7.1 ug/dL (ref 4.5–12.0)
TSH: 2.91 u[IU]/mL (ref 0.450–4.500)
Total Protein: 7.4 g/dL (ref 6.0–8.5)
Triglycerides: 95 mg/dL (ref 0–149)
Uric Acid: 4.3 mg/dL (ref 3.0–7.2)
VLDL Cholesterol Cal: 18 mg/dL (ref 5–40)
WBC: 5.4 10*3/uL (ref 3.4–10.8)
eGFR: 91 mL/min/{1.73_m2} (ref >59)

## 2020-10-23 ENCOUNTER — Encounter: Payer: Self-pay | Admitting: Nurse Practitioner

## 2020-10-23 ENCOUNTER — Ambulatory Visit: Payer: Self-pay | Admitting: Nurse Practitioner

## 2020-10-23 ENCOUNTER — Other Ambulatory Visit: Payer: Self-pay

## 2020-10-23 VITALS — BP 132/76 | HR 75 | Temp 97.6°F | Resp 14 | Ht 68.0 in | Wt 170.0 lb

## 2020-10-23 DIAGNOSIS — Z0000 Encounter for general adult medical examination without abnormal findings: Secondary | ICD-10-CM

## 2020-10-23 DIAGNOSIS — H8110 Benign paroxysmal vertigo, unspecified ear: Secondary | ICD-10-CM

## 2020-10-23 MED ORDER — MECLIZINE HCL 12.5 MG PO TABS: 25.0000 mg | ORAL_TABLET | Freq: Three times a day (TID) | ORAL | Status: AC | PRN

## 2020-10-23 NOTE — Patient Instructions (Signed)
Health Maintenance, Female Adopting a healthy lifestyle and getting preventive care are important in promoting health and wellness. Ask your health care provider about:  The right schedule for you to have regular tests and exams.  Things you can do on your own to prevent diseases and keep yourself healthy. What should I know about diet, weight, and exercise? Eat a healthy diet  Eat a diet that includes plenty of vegetables, fruits, low-fat dairy products, and lean protein.  Do not eat a lot of foods that are high in solid fats, added sugars, or sodium.   Maintain a healthy weight Body mass index (BMI) is used to identify weight problems. It estimates body fat based on height and weight. Your health care provider can help determine your BMI and help you achieve or maintain a healthy weight. Get regular exercise Get regular exercise. This is one of the most important things you can do for your health. Most adults should:  Exercise for at least 150 minutes each week. The exercise should increase your heart rate and make you sweat (moderate-intensity exercise).  Do strengthening exercises at least twice a week. This is in addition to the moderate-intensity exercise.  Spend less time sitting. Even light physical activity can be beneficial. Watch cholesterol and blood lipids Have your blood tested for lipids and cholesterol at 51 years of age, then have this test every 5 years. Have your cholesterol levels checked more often if:  Your lipid or cholesterol levels are high.  You are older than 51 years of age.  You are at high risk for heart disease. What should I know about cancer screening? Depending on your health history and family history, you may need to have cancer screening at various ages. This may include screening for:  Breast cancer.  Cervical cancer.  Colorectal cancer.  Skin cancer.  Lung cancer. What should I know about heart disease, diabetes, and high blood  pressure? Blood pressure and heart disease  High blood pressure causes heart disease and increases the risk of stroke. This is more likely to develop in people who have high blood pressure readings, are of African descent, or are overweight.  Have your blood pressure checked: ? Every 3-5 years if you are 18-39 years of age. ? Every year if you are 40 years old or older. Diabetes Have regular diabetes screenings. This checks your fasting blood sugar level. Have the screening done:  Once every three years after age 40 if you are at a normal weight and have a low risk for diabetes.  More often and at a younger age if you are overweight or have a high risk for diabetes. What should I know about preventing infection? Hepatitis B If you have a higher risk for hepatitis B, you should be screened for this virus. Talk with your health care provider to find out if you are at risk for hepatitis B infection. Hepatitis C Testing is recommended for:  Everyone born from 1945 through 1965.  Anyone with known risk factors for hepatitis C. Sexually transmitted infections (STIs)  Get screened for STIs, including gonorrhea and chlamydia, if: ? You are sexually active and are younger than 51 years of age. ? You are older than 51 years of age and your health care provider tells you that you are at risk for this type of infection. ? Your sexual activity has changed since you were last screened, and you are at increased risk for chlamydia or gonorrhea. Ask your health care provider   if you are at risk.  Ask your health care provider about whether you are at high risk for HIV. Your health care provider may recommend a prescription medicine to help prevent HIV infection. If you choose to take medicine to prevent HIV, you should first get tested for HIV. You should then be tested every 3 months for as long as you are taking the medicine. Pregnancy  If you are about to stop having your period (premenopausal) and  you may become pregnant, seek counseling before you get pregnant.  Take 400 to 800 micrograms (mcg) of folic acid every day if you become pregnant.  Ask for birth control (contraception) if you want to prevent pregnancy. Osteoporosis and menopause Osteoporosis is a disease in which the bones lose minerals and strength with aging. This can result in bone fractures. If you are 65 years old or older, or if you are at risk for osteoporosis and fractures, ask your health care provider if you should:  Be screened for bone loss.  Take a calcium or vitamin D supplement to lower your risk of fractures.  Be given hormone replacement therapy (HRT) to treat symptoms of menopause. Follow these instructions at home: Lifestyle  Do not use any products that contain nicotine or tobacco, such as cigarettes, e-cigarettes, and chewing tobacco. If you need help quitting, ask your health care provider.  Do not use street drugs.  Do not share needles.  Ask your health care provider for help if you need support or information about quitting drugs. Alcohol use  Do not drink alcohol if: ? Your health care provider tells you not to drink. ? You are pregnant, may be pregnant, or are planning to become pregnant.  If you drink alcohol: ? Limit how much you use to 0-1 drink a day. ? Limit intake if you are breastfeeding.  Be aware of how much alcohol is in your drink. In the [place] one drink equals one 12 oz bottle of beer (355 mL), one 5 oz glass of wine (148 mL), or one 1 oz glass of hard liquor (44 mL). General instructions  Schedule regular health, dental, and eye exams.  Stay current with your vaccines.  Tell your health care provider if: ? You often feel depressed. ? You have ever been abused or do not feel safe at home. Summary  Adopting a healthy lifestyle and getting preventive care are important in promoting health and wellness.  Follow your health care provider's instructions about healthy  diet, exercising, and getting tested or screened for diseases.  Follow your health care provider's instructions on monitoring your cholesterol and blood pressure. This information is not intended to replace advice given to you by your health care provider. Make sure you discuss any questions you have with your health care provider. Document Revised: 05/12/2018 Document Reviewed: 05/12/2018 Elsevier Patient Education  2021 [employer] Inc.  

## 2020-10-23 NOTE — Progress Notes (Signed)
Pt requesting refill on Meclizine 25mg .

## 2020-10-23 NOTE — Progress Notes (Signed)
Subjective:    Patient ID: Lisa Stevenson, female    DOB: [birth year], 50 y.o.   MRN: [id number]  HPI Lisa Stevenson is a 50 y.o. female who presents to the [facility] for her annual exam. She is employed in the [employer] department and has been working there for 6 years. She states that she enjoys her job. She is requesting a refill on her Antivert. She denies any current problems.  Past Medical History:  Diagnosis Date  . Anemia   . 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 History of neck swelling   . MVP (mitral valve prolapse)   . Plantar fasciitis   . PONV (postoperative nausea and vomiting)    Past Surgical History:  Procedure Laterality Date  . BREAST SURGERY    . DILITATION & CURRETTAGE/HYSTROSCOPY WITH NOVASURE ABLATION N/A 03/22/2018   Procedure: DILATATION & CURETTAGE/HYSTEROSCOPY WITH ABLATION- MINERVA;  Surgeon: Schermerhorn, Gwen Her, MD;  Location: [facility];  Service: Gynecology;  Laterality: N/A;  . EXCISION MASS NECK N/A 11/18/2016   Procedure: EXCISION MASS NECK;  Surgeon: Beverly Gust, MD;  Location: [facility];  Service: ENT;  Laterality: N/A;   SH: Widow, lives alone, feels safe, no tobacco or drug use, occasional ETOH  Immunizations: did not have Covid Vaccine but did have Covid, had flu vaccine  Diet/Exercise: currently seeing a nutritionist and also doing strength training, hiking and come cardo  Current Outpatient Medications on File Prior to Visit  Medication Sig Dispense Refill  . ibuprofen (ADVIL,MOTRIN) 200 MG tablet Take 400-600 mg by mouth daily as needed for moderate pain.    . methylPREDNISolone (MEDROL) 4 MG TBPK tablet Dispense Medrol Dosepak as directed 21 tablet 0  . Multiple Vitamin (MULTIVITAMINS PO) Take by mouth.    . predniSONE (STERAPRED UNI-PAK 21 TAB) 10 MG (21) TBPK tablet     . THYROID PO Take 2 capsules by mouth daily. GAIA HERBS     No current facility-administered medications on file prior to  visit.  Patient does report that she takes essential oil supplement and herbal thyroid supplement.   No Known Allergies     Review of Systems  Neurological:       Occasional vertigo  All other systems reviewed and are negative.     Objective:   Physical Exam Vitals and nursing note reviewed.  Constitutional:      General: She is not in acute distress.    Appearance: Normal appearance. She is well-developed, well-groomed and normal weight.  HENT:     Head: Normocephalic and atraumatic.     Jaw: There is normal jaw occlusion.     Right Ear: Tympanic membrane, ear canal and external ear normal.     Left Ear: Tympanic membrane, ear canal and external ear normal.     Nose: Nose normal.     Mouth/Throat:     Lips: Pink.     Mouth: Mucous membranes are moist.     Dentition: Normal dentition.     Tongue: No lesions.     Pharynx: Oropharynx is clear.  Eyes:     General: Lids are normal. Gaze aligned appropriately.     Extraocular Movements: Extraocular movements intact.     Conjunctiva/sclera: Conjunctivae normal.     Pupils: Pupils are equal, round, and reactive to light.     Funduscopic exam:    Right eye: Red reflex present.  Left eye: Red reflex present.    Comments: Wearing contact lenses. Has regular visits with ophthalmologist   Neck:     Thyroid: No thyroid mass or thyroid tenderness.     Vascular: Normal carotid pulses. No carotid bruit or JVD.     Trachea: Trachea normal.  Cardiovascular:     Rate and Rhythm: Normal rate and regular rhythm.     Pulses:          Carotid pulses are 2+ on the right side and 2+ on the left side.      Radial pulses are 2+ on the right side and 2+ on the left side.       Posterior tibial pulses are 2+ on the right side and 2+ on the left side.     Heart sounds: No murmur heard.   Pulmonary:     Effort: Pulmonary effort is normal.     Breath sounds: Normal breath sounds and air entry.  Abdominal:     General: Abdomen is flat.  Bowel sounds are normal.     Palpations: Abdomen is soft.     Tenderness: There is no abdominal tenderness. There is no right CVA tenderness or left CVA tenderness.  Musculoskeletal:        General: Normal range of motion.     Cervical back: Normal range of motion. No rigidity. No pain with movement, spinous process tenderness or muscular tenderness.     Lumbar back: Negative right straight leg raise test and negative left straight leg raise test.     Right lower leg: No edema.     Left lower leg: No edema.  Lymphadenopathy:     Cervical: No cervical adenopathy.  Skin:    General: Skin is warm and dry.  Neurological:     Mental Status: She is alert.     Cranial Nerves: No facial asymmetry.     Motor: No weakness, tremor or pronator drift.     Coordination: Romberg sign negative.     Gait: Gait is intact.     Deep Tendon Reflexes:     Reflex Scores:      Bicep reflexes are 2+ on the right side and 2+ on the left side.      Brachioradialis reflexes are 2+ on the right side and 2+ on the left side.      Patellar reflexes are 2+ on the right side and 2+ on the left side.    Comments: Ambulatory with steady gait, stands on one foot without difficulty. Grips are equal.   Psychiatric:        Attention and Perception: Attention normal.        Mood and Affect: Mood normal.        Speech: Speech normal.        Behavior: Behavior normal. Behavior is cooperative.        Thought Content: Thought content normal.       Assessment & Plan:  1. Annual physical exam  2. Benign paroxysmal positional vertigo, unspecified laterality - meclizine (ANTIVERT) tablet 25 mg  Discussed with the patient clinical and lab findings and plan of care. Patient given the opportunity to ask questions. All questions fully answered and patient voices understanding and agrees with plan. She will plan to RTC in one year or sooner for any problems.

## 2020-12-14 ENCOUNTER — Other Ambulatory Visit: Payer: Self-pay

## 2020-12-14 DIAGNOSIS — E039 Hypothyroidism, unspecified: Secondary | ICD-10-CM

## 2020-12-14 NOTE — Progress Notes (Signed)
Pt presents today requesting lab work for nutritionist Janalyn Shy. Provider stated she has a lot of iodine in her system for a while due to supplements. Requesting a panel and antibodies. CL,RMA

## 2020-12-24 LAB — THYROID PANEL WITH TSH
Free Thyroxine Index: 1.8 (ref 1.2–4.9)
T3 Uptake Ratio: 26 % (ref 24–39)
T4, Total: 6.8 ug/dL (ref 4.5–12.0)
TSH: 2.09 u[IU]/mL (ref 0.450–4.500)

## 2020-12-24 LAB — COMPREHENSIVE THYROGLOBULIN
Anti-Thyroglobulin Antibodies: 5.3 IU/mL — ABNORMAL HIGH
Thyroglobulin (TG-RIA): 5.5 ng/mL

## 2020-12-24 LAB — T3, REVERSE: Reverse T3, Serum: 14.8 ng/dL (ref 9.2–24.1)

## 2020-12-24 LAB — THYROID PEROXIDASE ANTIBODY: Thyroperoxidase Ab SerPl-aCnc: 115 IU/mL — ABNORMAL HIGH (ref 0–34)

## 2020-12-25 ENCOUNTER — Ambulatory Visit: Payer: Self-pay | Admitting: Physician Assistant

## 2020-12-25 ENCOUNTER — Other Ambulatory Visit: Payer: Self-pay

## 2020-12-25 ENCOUNTER — Encounter: Payer: Self-pay | Admitting: Physician Assistant

## 2020-12-25 VITALS — BP 112/65 | HR 73 | Temp 97.7°F | Resp 14 | Ht 68.0 in | Wt 169.0 lb

## 2020-12-25 DIAGNOSIS — H6502 Acute serous otitis media, left ear: Secondary | ICD-10-CM

## 2020-12-25 MED ORDER — FEXOFENADINE-PSEUDOEPHED ER 60-120 MG PO TB12: 1.0000 | ORAL_TABLET | Freq: Two times a day (BID) | ORAL | 0 refills | Status: AC

## 2020-12-25 MED ORDER — MECLIZINE HCL 25 MG PO TABS: 25.0000 mg | ORAL_TABLET | Freq: Three times a day (TID) | ORAL | 0 refills | Status: AC | PRN

## 2020-12-25 MED ORDER — AMOXICILLIN 875 MG PO TABS: 875.0000 mg | ORAL_TABLET | Freq: Two times a day (BID) | ORAL | 0 refills | Status: AC

## 2020-12-25 NOTE — Progress Notes (Signed)
   Subjective: Left ear pain    Patient ID: Lisa Stevenson, female    DOB: 1971/03/09, 50 y.o.   MRN: [id number]  HPI Patient presents with 2 and half week of left ear pain.  Patient described the pain as "achy".  Patient states hearing loss.  Patient states she feels like fluid behind her ear.  States mild vertigo.   Review of Systems Chronic back and hip pain.  Intermittent vertigo    Objective:   Physical Exam No acute distress.  Temperature 97.7, pulse 73, respiration 14, BP is 112/65, patient weighs 169 pounds. HEENT is remarkable for edematous and erythematous left TM.  Patient also has some mild guarding palpation of the left maxillary sinus. Neck is supple for adenopathy or bruits.  Lungs are clear to auscultation.  Heart is regular rate and rhythm.       Assessment & Plan: Left otitis media.   Patient given prescription for amoxicillin and fexofenadine with pseudoephedrine.  Patient advised follow-up in 1 week if no improvement or worsening complaint.

## 2020-12-25 NOTE — Progress Notes (Signed)
Pt having symptoms in left ear of pressure,itchy, and clogged feeling for about 3 weeks. CL,RMA

## 2021-08-27 ENCOUNTER — Other Ambulatory Visit: Payer: Self-pay

## 2021-08-27 ENCOUNTER — Ambulatory Visit: Payer: Self-pay

## 2021-08-27 DIAGNOSIS — Z0000 Encounter for general adult medical examination without abnormal findings: Secondary | ICD-10-CM

## 2021-08-27 LAB — POCT URINALYSIS DIPSTICK
Bilirubin, UA: NEGATIVE
Blood, UA: NEGATIVE
Glucose, UA: NEGATIVE
Ketones, UA: NEGATIVE
Leukocytes, UA: NEGATIVE
Nitrite, UA: NEGATIVE
Protein, UA: NEGATIVE
Spec Grav, UA: 1.025 (ref 1.010–1.025)
Urobilinogen, UA: 0.2 E.U./dL
pH, UA: 6 (ref 5.0–8.0)

## 2021-08-27 NOTE — Progress Notes (Signed)
Pt presents today for physical labs, will return to clinic for scheduled physical.  

## 2021-08-28 LAB — CMP12+LP+TP+TSH+6AC+CBC/D/PLT
ALT: 11 IU/L (ref 0–32)
AST: 13 IU/L (ref 0–40)
Albumin/Globulin Ratio: 1.5 (ref 1.2–2.2)
Albumin: 4.7 g/dL (ref 3.8–4.9)
Alkaline Phosphatase: 73 IU/L (ref 44–121)
BUN/Creatinine Ratio: 18 (ref 9–23)
BUN: 13 mg/dL (ref 6–24)
Basophils Absolute: 0 10*3/uL (ref 0.0–0.2)
Basos: 1 %
Bilirubin Total: 0.7 mg/dL (ref 0.0–1.2)
Calcium: 10 mg/dL (ref 8.7–10.2)
Chloride: 100 mmol/L (ref 96–106)
Chol/HDL Ratio: 3.5 ratio (ref 0.0–4.4)
Cholesterol, Total: 190 mg/dL (ref 100–199)
Creatinine, Ser: 0.73 mg/dL (ref 0.57–1.00)
EOS (ABSOLUTE): 0.1 10*3/uL (ref 0.0–0.4)
Eos: 2 %
Estimated CHD Risk: 0.5 times avg. (ref 0.0–1.0)
Free Thyroxine Index: 2.1 (ref 1.2–4.9)
GGT: 12 IU/L (ref 0–60)
Globulin, Total: 3.1 g/dL (ref 1.5–4.5)
Glucose: 93 mg/dL (ref 70–99)
HDL: 55 mg/dL (ref >39)
Hematocrit: 45 % (ref 34.0–46.6)
Hemoglobin: 15 g/dL (ref 11.1–15.9)
Immature Grans (Abs): 0 10*3/uL (ref 0.0–0.1)
Immature Granulocytes: 0 %
Iron: 149 ug/dL (ref 27–159)
LDH: 127 IU/L (ref 119–226)
LDL Chol Calc (NIH): 113 mg/dL — ABNORMAL HIGH (ref 0–99)
Lymphocytes Absolute: 1.5 10*3/uL (ref 0.7–3.1)
Lymphs: 23 %
MCH: 29.6 pg (ref 26.6–33.0)
MCHC: 33.3 g/dL (ref 31.5–35.7)
MCV: 89 fL (ref 79–97)
Monocytes Absolute: 0.5 10*3/uL (ref 0.1–0.9)
Monocytes: 7 %
Neutrophils Absolute: 4.6 10*3/uL (ref 1.4–7.0)
Neutrophils: 67 %
Phosphorus: 4.1 mg/dL (ref 3.0–4.3)
Platelets: 218 10*3/uL (ref 150–450)
Potassium: 3.8 mmol/L (ref 3.5–5.2)
RBC: 5.06 x10E6/uL (ref 3.77–5.28)
RDW: 12.8 % (ref 11.7–15.4)
Sodium: 140 mmol/L (ref 134–144)
T3 Uptake Ratio: 30 % (ref 24–39)
T4, Total: 7 ug/dL (ref 4.5–12.0)
TSH: 2.19 u[IU]/mL (ref 0.450–4.500)
Total Protein: 7.8 g/dL (ref 6.0–8.5)
Triglycerides: 121 mg/dL (ref 0–149)
Uric Acid: 4.5 mg/dL (ref 3.0–7.2)
VLDL Cholesterol Cal: 22 mg/dL (ref 5–40)
WBC: 6.7 10*3/uL (ref 3.4–10.8)
eGFR: 99 mL/min/{1.73_m2} (ref >59)

## 2021-09-03 ENCOUNTER — Ambulatory Visit: Payer: Self-pay | Admitting: Physician Assistant

## 2021-09-03 ENCOUNTER — Encounter: Payer: Self-pay | Admitting: Physician Assistant

## 2021-09-03 VITALS — BP 106/65 | HR 70 | Temp 97.7°F | Resp 14 | Ht 68.0 in | Wt 160.0 lb

## 2021-09-03 DIAGNOSIS — Z0000 Encounter for general adult medical examination without abnormal findings: Secondary | ICD-10-CM

## 2021-09-03 NOTE — Progress Notes (Signed)
Pt presents today for physical. Pt denies any issues or concerns at this time/CL,RMA ?

## 2021-09-03 NOTE — Progress Notes (Signed)
? ?[facility] ? ?____________________________________________ ? ? None  ?  (approximate) ? ?I have reviewed the triage vital signs and the nursing notes. ? ? ?HISTORY ? ?Chief Complaint ?Annual Exam ? ? ?HPI ?Lisa Stevenson is a 51 y.o. female patient presents annual physical exam.  Patient is voiced no concerns or complaints.  Past medical history remarkable for anemia and mitral valve prolapse. ?   ? ?  ? ? ?Past Medical History:  ?Diagnosis Date  ? Anemia   ? Breast mass 6 or 51 years old  ? 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 History of neck swelling   ? MVP (mitral valve prolapse)   ? Plantar fasciitis   ? PONV (postoperative nausea and vomiting)   ? ? ?Patient Active Problem List  ? Diagnosis Date Noted  ? Menorrhagia with regular cycle 10/19/2019  ? Acute bilateral low back pain without sciatica 09/29/2019  ? Chronic right hip pain 09/29/2019  ? Libido, decreased 05/10/2013  ? Itching 11/21/2012  ? Well adult exam 11/17/2012  ? ? ?Past Surgical History:  ?Procedure Laterality Date  ? BREAST SURGERY    ? DILITATION & CURRETTAGE/HYSTROSCOPY WITH NOVASURE ABLATION N/A 03/22/2018  ? Procedure: DILATATION & CURETTAGE/HYSTEROSCOPY WITH ABLATION- MINERVA;  Surgeon: Schermerhorn, Gwen Her, MD;  Location: [facility];  Service: Gynecology;  Laterality: N/A;  ? EXCISION MASS NECK N/A 11/18/2016  ? Procedure: EXCISION MASS NECK;  Surgeon: Beverly Gust, MD;  Location: [facility];  Service: ENT;  Laterality: N/A;  ? ? ?Prior to Admission medications   ?Medication Sig Start Date End Date Taking? Authorizing Provider  ?cetirizine (ZYRTEC ALLERGY) 10 MG tablet Take 10 mg by mouth daily.   Yes [provider]  ?ibuprofen (ADVIL,MOTRIN) 200 MG tablet Take 400-600 mg by mouth daily as needed for moderate pain.   Yes [provider]  ?meclizine (ANTIVERT) 25 MG tablet Take 1 tablet (25 mg total) by mouth 3 (three) times daily as needed for dizziness. 12/25/20  Yes Sable Feil, PA-C  ?Multiple Vitamin (MULTIVITAMINS PO) Take by mouth.   Yes [provider]  ?fexofenadine-pseudoephedrine (ALLEGRA-D) 60-120 MG 12 hr tablet Take 1 tablet by mouth 2 (two) times daily. ?Patient not taking: Reported on 09/03/2021 12/25/20   Sable Feil, PA-C  ? ? ?Allergies ?Patient has no known allergies. ? ?Family History  ?Problem Relation Age of Onset  ? Breast cancer Maternal Aunt 48  ?     Cancer started in uterus spend to breast  ? ? ?Social History ?Social History  ? ?Tobacco Use  ? Smoking status: Never  ? Smokeless tobacco: Never  ?Vaping Use  ? Vaping Use: Never used  ?Substance Use Topics  ? Alcohol use: Yes  ?  Comment: SOCIAL  ? Drug use: No  ? ? ?Review of Systems ?Constitutional: No fever/chills ?Eyes: No visual changes. ?ENT: No sore throat. ?Cardiovascular: Denies chest pain. ?Respiratory: Denies shortness of breath. ?Gastrointestinal: No abdominal pain.  No nausea, no vomiting.  No diarrhea.  No constipation. ?Genitourinary: Negative for dysuria. ?Musculoskeletal: Negative for back pain. ?Skin: Negative for rash. ?Neurological: Negative for headaches, focal weakness or numbness. ?Hematological/Lymphatic: Anemia ? ? ?____________________________________________ ? ? ?PHYSICAL EXAM: ? ?VITAL SIGNS: Temperature is 97.7, pulse 70, respiration 14, BP is 106/65, patient 95% O2 sat on room air.  Patient weighs 160 pounds and BMI is 24.33. ?Constitutional: Alert and oriented. Well appearing and in no acute distress. ?Eyes: Conjunctivae are normal. PERRL.  EOMI. ?Head: Atraumatic. ?Nose: No congestion/rhinnorhea. ?Mouth/Throat: Mucous membranes are moist.  Oropharynx non-erythematous. ?Neck: No stridor.  No cervical spine tenderness to palpation. ?Hematological/Lymphatic/Immunilogical: No cervical lymphadenopathy. ?Cardiovascular: Normal rate, regular rhythm. Grossly normal heart sounds.  Good peripheral circulation. ?Respiratory: Normal respiratory effort.  No retractions. Lungs  CTAB. ?Gastrointestinal: Soft and nontender. No distention. No abdominal bruits. No CVA tenderness. ?Genitourinary: Deferred ?Musculoskeletal: No lower extremity tenderness nor edema.  No joint effusions. ?Neurologic:  Normal speech and language. No gross focal neurologic deficits are appreciated. No gait instability. ?Skin:  Skin is warm, dry and intact. No rash noted. ?Psychiatric: Mood and affect are normal. Speech and behavior are normal. ? ?____________________________________________ ?  ?LABS ?___ ?      ?Component Ref Range & Units 7 d ago 10 mo ago 1 yr ago  ?Color, UA  YELLOW  Yellow  Yellow   ?Clarity, UA  CLEAR  Clear  Clear   ?Glucose, UA Negative Negative  Negative  Negative   ?Bilirubin, UA  NEGATIVE  Negative  Negative   ?Ketones, UA  NEGATIVE  Positive CM  Negative   ?Spec Grav, UA 1.010 - 1.025 1.025  1.015  1.025   ?Blood, UA  NEGATIVE  Negative  Negative   ?pH, UA 5.0 - 8.0 6.0  6.0  6.0   ?Protein, UA Negative Negative  Negative  Negative   ?Urobilinogen, UA 0.2 or 1.0 E.U./dL 0.2  0.2  0.2   ?Nitrite, UA  NEGATIVE  Negative  Negative   ?Leukocytes, UA Negative Negative  Negative  Negative   ?Appearance        ?Odor        ?  ? ?  ?  ?Specimen Collected: 08/27/21 10:05 Last Resulted: 08/27/21 10:05  ?  ?  Lab Flowsheet   ? Order Details   ? View Encounter   ? Lab and Collection Details   ? Routing   ? Result History    ?View Encounter Conversation    ?  ?CM=Additional comments    ?  ?Result Care Coordination ? ? ?Patient Communication ? ? Add Comments   Seen Back to Top  ?  ?  ? ?Other Results from 08/27/2021 ? ? Contains abnormal data CMP12+LP+TP+TSH+6AC+CBC/D/Plt ?Order: [id number] ?Status: Final result    ?Visible to patient: Yes (not seen)    ?Next appt: None    ?Dx: Routine adult health maintenance    ?0 Result Notes ?         ?Component Ref Range & Units 7 d ago ?(08/27/21) 8 mo ago ?(12/14/20) 10 mo ago ?(10/18/20) 1 yr ago ?(10/20/19) 3 yr ago ?(03/15/18) 3 yr ago ?(03/15/18)  ?Glucose 70 - 99  mg/dL 93   96 R  106 High  R  93    ?Uric Acid 3.0 - 7.2 mg/dL 4.5   4.3 CM  3.9 R, CM     ?Comment:            Therapeutic target for gout patients: <6.0  ?BUN 6 - 24 mg/dL 13   12  13  13  R    ?Creatinine, Ser 0.57 - 1.00 mg/dL 0.73   0.79  0.79  0.78 R    ?eGFR >59 mL/min/1.73 99   91      ?BUN/Creatinine Ratio 9 - 23 18   15  16      ?Sodium 134 - 144 mmol/L 140   138  141  136 R    ?Potassium 3.5 - 5.2  mmol/L 3.8   3.5  4.1  3.7 R    ?Chloride 96 - 106 mmol/L 100   99  104  103 R    ?Calcium 8.7 - 10.2 mg/dL 10.0   9.0  9.5  9.0 R    ?Phosphorus 3.0 - 4.3 mg/dL 4.1   2.6 Low   3.5     ?Total Protein 6.0 - 8.5 g/dL 7.8   7.4  7.5     ?Albumin 3.8 - 4.9 g/dL 4.7   4.6  4.4 R     ?Globulin, Total 1.5 - 4.5 g/dL 3.1   2.8  3.1     ?Albumin/Globulin Ratio 1.2 - 2.2 1.5   1.6  1.4     ?Bilirubin Total 0.0 - 1.2 mg/dL 0.7   0.4  0.7     ?Alkaline Phosphatase 44 - 121 IU/L 73   61  60 R, CM     ?LDH 119 - 226 IU/L 127   131  123     ?AST 0 - 40 IU/L 13   19  13      ?ALT 0 - 32 IU/L 11   17  9      ?GGT 0 - 60 IU/L 12   12  11      ?Iron 27 - 159 ug/dL 149   29  86     ?Cholesterol, Total 100 - 199 mg/dL 190   142  171     ?Triglycerides 0 - 149 mg/dL 121   95  88     ?HDL >39 mg/dL 55   42  47     ?VLDL Cholesterol Cal 5 - 40 mg/dL 22   18  16      ?LDL Chol Calc (NIH) 0 - 99 mg/dL 113 High    82  108 High      ?Chol/HDL Ratio 0.0 - 4.4 ratio 3.5   3.4 CM  3.6 CM     ?Comment:                                   T. Chol/HDL Ratio  ?                                            Men  Women  ?                              1/2 Avg.Risk  3.4    3.3  ?                                  Avg.Risk  5.0    4.4  ?                               2X Avg.Risk  9.6    7.1  ?                               3X Avg.Risk 23.4   11.0   ?Estimated CHD Risk 0.0 - 1.0 times avg. 0.5   0.5 CM  0.7 CM     ?  Comment: The CHD Risk is based on the T. Chol/HDL ratio. Other  ?factors affect CHD Risk such as hypertension, smoking,  ?diabetes, severe obesity,  and family history of  ?premature CHD.   ?TSH 0.450 - 4.500 uIU/mL 2.190  2.090  2.910  2.490     ?T4, Total 4.5 - 12.0 ug/dL 7.0  6.8  7.1  6.6     ?T3 Uptake Ratio 24 - 39 % 30  26  29  29      ?Free Thyr

## 2021-10-31 ENCOUNTER — Other Ambulatory Visit: Payer: Self-pay | Admitting: Physician Assistant

## 2021-10-31 DIAGNOSIS — Z1231 Encounter for screening mammogram for malignant neoplasm of breast: Secondary | ICD-10-CM

## 2021-12-12 ENCOUNTER — Encounter: Payer: Self-pay | Admitting: Radiology

## 2021-12-12 ENCOUNTER — Ambulatory Visit
Admission: RE | Admit: 2021-12-12 | Discharge: 2021-12-12 | Disposition: A | Discharge status: Home / Self Care | Payer: 59 | Source: Ambulatory Visit | Attending: Physician Assistant | Admitting: Physician Assistant

## 2021-12-12 DIAGNOSIS — Z1231 Encounter for screening mammogram for malignant neoplasm of breast: Secondary | ICD-10-CM | POA: Insufficient documentation

## 2023-03-18 LAB — COLOGUARD: COLOGUARD: NEGATIVE
